# Patient Record
Sex: Female | Born: 1955
Health system: Southern US, Community
[De-identification: ages and names within clinical notes are randomized; demographics above are authoritative.]

## PROBLEM LIST (undated history)

## (undated) DIAGNOSIS — E7800 Pure hypercholesterolemia, unspecified: Secondary | ICD-10-CM

## (undated) DIAGNOSIS — J302 Other seasonal allergic rhinitis: Secondary | ICD-10-CM

## (undated) DIAGNOSIS — Z98811 Dental restoration status: Secondary | ICD-10-CM

## (undated) DIAGNOSIS — M1990 Unspecified osteoarthritis, unspecified site: Secondary | ICD-10-CM

## (undated) DIAGNOSIS — M67439 Ganglion, unspecified wrist: Secondary | ICD-10-CM

---

## 1988-05-12 HISTORY — PX: ORIF ANKLE FRACTURE: SHX5408

## 2000-03-17 ENCOUNTER — Encounter: Payer: Self-pay | Admitting: Family Medicine

## 2000-03-17 ENCOUNTER — Encounter: Admission: RE | Admit: 2000-03-17 | Discharge: 2000-03-17 | Payer: Self-pay | Admitting: Family Medicine

## 2000-03-24 ENCOUNTER — Encounter: Admission: RE | Admit: 2000-03-24 | Discharge: 2000-04-14 | Payer: Self-pay | Admitting: Family Medicine

## 2001-12-14 ENCOUNTER — Other Ambulatory Visit: Admission: RE | Admit: 2001-12-14 | Discharge: 2001-12-14 | Payer: Self-pay | Admitting: Obstetrics and Gynecology

## 2002-12-08 ENCOUNTER — Other Ambulatory Visit: Admission: RE | Admit: 2002-12-08 | Discharge: 2002-12-08 | Payer: Self-pay | Admitting: Obstetrics and Gynecology

## 2009-05-30 DIAGNOSIS — D239 Other benign neoplasm of skin, unspecified: Secondary | ICD-10-CM

## 2009-05-30 DIAGNOSIS — C4491 Basal cell carcinoma of skin, unspecified: Secondary | ICD-10-CM

## 2009-05-30 HISTORY — DX: Basal cell carcinoma of skin, unspecified: C44.91

## 2009-05-30 HISTORY — DX: Other benign neoplasm of skin, unspecified: D23.9

## 2010-08-28 ENCOUNTER — Other Ambulatory Visit: Payer: Self-pay | Admitting: Physician Assistant

## 2010-08-28 ENCOUNTER — Ambulatory Visit
Admission: RE | Admit: 2010-08-28 | Discharge: 2010-08-28 | Disposition: A | Discharge status: Home / Self Care | Payer: 59 | Source: Ambulatory Visit | Attending: Physician Assistant | Admitting: Physician Assistant

## 2010-08-28 DIAGNOSIS — R52 Pain, unspecified: Secondary | ICD-10-CM

## 2012-10-10 DIAGNOSIS — M67439 Ganglion, unspecified wrist: Secondary | ICD-10-CM

## 2012-10-10 HISTORY — DX: Ganglion, unspecified wrist: M67.439

## 2012-10-11 ENCOUNTER — Other Ambulatory Visit: Payer: Self-pay | Admitting: Orthopedic Surgery

## 2012-10-15 ENCOUNTER — Encounter (HOSPITAL_BASED_OUTPATIENT_CLINIC_OR_DEPARTMENT_OTHER): Payer: Self-pay | Admitting: *Deleted

## 2012-10-20 NOTE — H&P (Signed)
  Lisa Mclean is an 57 y.o. female.   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  She has had a six month history of enlarging mass on the volar aspect of her left wrist.  She is also troubled by pain at the base of her thumb.  She has no antecedent history of injury.  Lisa Mclean notes discomfort and occasional numbness radiating towards the base of her thumb.  She has had no treatment of this predicament to date.    Past Medical History  Diagnosis Date  . Ganglion cyst of wrist 10/2012    left  . Arthritis     hands, shoulders, hips  . Seasonal allergies     cough 10/15/2012  . Dental crowns present     Past Surgical History  Procedure Laterality Date  . Orif ankle fracture Left 1990    Family History  Problem Relation Age of Onset  . Anesthesia problems Father     hard to wake up post-op   Social History:  reports that she has never smoked. She has never used smokeless tobacco. She reports that  drinks alcohol. She reports that she does not use illicit drugs.  Allergies:  Allergies  Allergen Reactions  . Lipitor (Atorvastatin) Other (See Comments)    DIZZINESS    No prescriptions prior to admission    No results found for this or any previous visit (from the past 48 hour(s)).  No results found.   Pertinent items are noted in HPI.  Height 5\' 2"  (1.575 m), weight 63.504 kg (140 lb).  General appearance: alert Head: Normocephalic, without obvious abnormality Neck: supple, symmetrical, trachea midline Resp: clear bilaterally Cardio: regular rate and rhythm GI: normal findings: bowel sounds normal Extremities:. Inspection of her hands and wrists reveals minimal shouldering of her left thumb CMC joint.  She does not have gross hyperextension of the thumb MP joint.  She has full range of motion of her fingers in flexion/extension.  There is a fullness  over the dorsal aspect of her left long finger carpometacarpal joint consistent with a carpal boss or os overlying the distal capitate.    She has wrist range of motion palmar flexion 90, dorsiflexion 85.  Her Watson maneuver is stable, but slightly uncomfortable on the left.  Her motor and sensory examination is entirely normal.   Plain x-rays of her wrist AP, lateral and two obliques demonstrate normal bony anatomy of the carpus.  She does have Eaton stage II degenerative change at the base of her thumb.   Pulses: 2+ and symmetric Skin: normal Neurologic: Grossly normal    Assessment/Plan Impression:Left wrist volar ganglion cyst. The degenerative nature of myxoid cysts was discussed with the patient and her husband in detail. They understand that there is a small risk of recurrence.  Plan: To the OR for excision of volar wrist ganglion left wrist.The procedure, risks,benefits and post-op course were discussed with the patient at length and they were in agreement with the plan.  [name],Charly Holcomb J 10/20/2012, 9:32 PM  H&P documentation: 10/21/2012  -History and Physical Reviewed  -Patient has been re-examined  -No change in the plan of care  Wyn Forster, MD

## 2012-10-21 ENCOUNTER — Ambulatory Visit (HOSPITAL_BASED_OUTPATIENT_CLINIC_OR_DEPARTMENT_OTHER)
Admission: RE | Admit: 2012-10-21 | Discharge: 2012-10-21 | Disposition: A | Discharge status: Home / Self Care | Payer: 59 | Source: Ambulatory Visit | Attending: Orthopedic Surgery | Admitting: Orthopedic Surgery

## 2012-10-21 ENCOUNTER — Encounter (HOSPITAL_BASED_OUTPATIENT_CLINIC_OR_DEPARTMENT_OTHER): Payer: Self-pay | Admitting: Anesthesiology

## 2012-10-21 ENCOUNTER — Encounter (HOSPITAL_BASED_OUTPATIENT_CLINIC_OR_DEPARTMENT_OTHER): Payer: Self-pay

## 2012-10-21 ENCOUNTER — Encounter (HOSPITAL_BASED_OUTPATIENT_CLINIC_OR_DEPARTMENT_OTHER)
Admission: RE | Disposition: A | Discharge status: Home / Self Care | Payer: Self-pay | Source: Ambulatory Visit | Attending: Orthopedic Surgery

## 2012-10-21 ENCOUNTER — Ambulatory Visit (HOSPITAL_BASED_OUTPATIENT_CLINIC_OR_DEPARTMENT_OTHER): Payer: 59 | Admitting: Anesthesiology

## 2012-10-21 DIAGNOSIS — M6740 Ganglion, unspecified site: Secondary | ICD-10-CM | POA: Insufficient documentation

## 2012-10-21 HISTORY — DX: Other seasonal allergic rhinitis: J30.2

## 2012-10-21 HISTORY — DX: Ganglion, unspecified wrist: M67.439

## 2012-10-21 HISTORY — DX: Dental restoration status: Z98.811

## 2012-10-21 HISTORY — DX: Unspecified osteoarthritis, unspecified site: M19.90

## 2012-10-21 HISTORY — PX: GANGLION CYST EXCISION: SHX1691

## 2012-10-21 SURGERY — EXCISION, GANGLION CYST, WRIST
Anesthesia: General | Site: Wrist | Laterality: Left | Wound class: Clean

## 2012-10-21 MED ORDER — LIDOCAINE HCL (CARDIAC) 20 MG/ML IV SOLN
INTRAVENOUS | Status: DC | PRN
  Administered 2012-10-21: 80 mg via INTRAVENOUS

## 2012-10-21 MED ORDER — OXYCODONE-ACETAMINOPHEN 5-325 MG PO TABS: ORAL_TABLET | ORAL | Status: DC

## 2012-10-21 MED ORDER — CHLORHEXIDINE GLUCONATE 4 % EX LIQD
60.0000 mL | Freq: Once | CUTANEOUS | Status: DC
Start: 2012-10-21 — End: 2012-10-21

## 2012-10-21 MED ORDER — DEXAMETHASONE SODIUM PHOSPHATE 4 MG/ML IJ SOLN
INTRAMUSCULAR | Status: DC | PRN
  Administered 2012-10-21: 10 mg via INTRAVENOUS

## 2012-10-21 MED ORDER — FENTANYL CITRATE 0.05 MG/ML IJ SOLN
INTRAMUSCULAR | Status: DC | PRN
  Administered 2012-10-21: 50 ug via INTRAVENOUS

## 2012-10-21 MED ORDER — FENTANYL CITRATE 0.05 MG/ML IJ SOLN
25.0000 ug | INTRAMUSCULAR | Status: DC | PRN
  Administered 2012-10-21: 50 ug via INTRAVENOUS

## 2012-10-21 MED ORDER — METOCLOPRAMIDE HCL 5 MG/ML IJ SOLN: 10.0000 mg | Freq: Once | INTRAMUSCULAR | Status: DC | PRN

## 2012-10-21 MED ORDER — MIDAZOLAM HCL 5 MG/5ML IJ SOLN
INTRAMUSCULAR | Status: DC | PRN
  Administered 2012-10-21: 1 mg via INTRAVENOUS

## 2012-10-21 MED ORDER — OXYCODONE HCL 5 MG PO TABS
5.0000 mg | ORAL_TABLET | Freq: Once | ORAL | Status: AC | PRN
  Administered 2012-10-21: 5 mg via ORAL

## 2012-10-21 MED ORDER — ONDANSETRON HCL 4 MG/2ML IJ SOLN
INTRAMUSCULAR | Status: DC | PRN
  Administered 2012-10-21: 4 mg via INTRAVENOUS

## 2012-10-21 MED ORDER — LACTATED RINGERS IV SOLN
INTRAVENOUS | Status: DC
  Administered 2012-10-21: 08:00:00 via INTRAVENOUS

## 2012-10-21 MED ORDER — FENTANYL CITRATE 0.05 MG/ML IJ SOLN: 50.0000 ug | INTRAMUSCULAR | Status: DC | PRN

## 2012-10-21 MED ORDER — LIDOCAINE HCL 2 % IJ SOLN
INTRAMUSCULAR | Status: DC | PRN
  Administered 2012-10-21: 2 mL via INTRADERMAL

## 2012-10-21 MED ORDER — PROPOFOL 10 MG/ML IV BOLUS
INTRAVENOUS | Status: DC | PRN
  Administered 2012-10-21: 150 mg via INTRAVENOUS

## 2012-10-21 MED ORDER — MIDAZOLAM HCL 2 MG/ML PO SYRP: 12.0000 mg | ORAL_SOLUTION | Freq: Once | ORAL | Status: DC | PRN

## 2012-10-21 MED ORDER — MIDAZOLAM HCL 2 MG/2ML IJ SOLN: 1.0000 mg | INTRAMUSCULAR | Status: DC | PRN

## 2012-10-21 MED ORDER — OXYCODONE HCL 5 MG/5ML PO SOLN: 5.0000 mg | Freq: Once | ORAL | Status: AC | PRN

## 2012-10-21 SURGICAL SUPPLY — 45 items
BANDAGE ADHESIVE 1X3 (GAUZE/BANDAGES/DRESSINGS) IMPLANT
BANDAGE ELASTIC 3 VELCRO ST LF (GAUZE/BANDAGES/DRESSINGS) ×2 IMPLANT
BANDAGE GAUZE ELAST BULKY 4 IN (GAUZE/BANDAGES/DRESSINGS) IMPLANT
BLADE MINI RND TIP GREEN BEAV (BLADE) IMPLANT
BLADE SURG 15 STRL LF DISP TIS (BLADE) ×1 IMPLANT
BLADE SURG 15 STRL SS (BLADE) ×1
BNDG ESMARK 4X9 LF (GAUZE/BANDAGES/DRESSINGS) ×2 IMPLANT
BRUSH SCRUB EZ PLAIN DRY (MISCELLANEOUS) ×2 IMPLANT
CLOTH BEACON ORANGE TIMEOUT ST (SAFETY) ×2 IMPLANT
CORDS BIPOLAR (ELECTRODE) ×2 IMPLANT
COVER MAYO STAND STRL (DRAPES) ×2 IMPLANT
COVER TABLE BACK 60X90 (DRAPES) ×2 IMPLANT
CUFF TOURNIQUET SINGLE 18IN (TOURNIQUET CUFF) ×2 IMPLANT
DECANTER SPIKE VIAL GLASS SM (MISCELLANEOUS) IMPLANT
DRAPE EXTREMITY T 121X128X90 (DRAPE) ×2 IMPLANT
DRAPE SURG 17X23 STRL (DRAPES) ×2 IMPLANT
GLOVE BIOGEL M STRL SZ7.5 (GLOVE) ×2 IMPLANT
GLOVE BIOGEL PI IND STRL 6.5 (GLOVE) ×1 IMPLANT
GLOVE BIOGEL PI IND STRL 7.5 (GLOVE) ×1 IMPLANT
GLOVE BIOGEL PI INDICATOR 6.5 (GLOVE) ×1
GLOVE BIOGEL PI INDICATOR 7.5 (GLOVE) ×1
GLOVE ECLIPSE 7.5 STRL STRAW (GLOVE) ×2 IMPLANT
GLOVE ORTHO TXT STRL SZ7.5 (GLOVE) ×2 IMPLANT
GOWN BRE IMP PREV XXLGXLNG (GOWN DISPOSABLE) ×2 IMPLANT
GOWN PREVENTION PLUS XLARGE (GOWN DISPOSABLE) ×6 IMPLANT
LOOP VESSEL MAXI BLUE (MISCELLANEOUS) ×2 IMPLANT
NEEDLE 27GAX1X1/2 (NEEDLE) ×2 IMPLANT
PACK BASIN DAY SURGERY FS (CUSTOM PROCEDURE TRAY) ×2 IMPLANT
PAD CAST 3X4 CTTN HI CHSV (CAST SUPPLIES) ×1 IMPLANT
PADDING CAST ABS 4INX4YD NS (CAST SUPPLIES) ×1
PADDING CAST ABS COTTON 4X4 ST (CAST SUPPLIES) ×1 IMPLANT
PADDING CAST COTTON 3X4 STRL (CAST SUPPLIES) ×1
SPLINT PLASTER CAST XFAST 3X15 (CAST SUPPLIES) ×4 IMPLANT
SPLINT PLASTER XTRA FASTSET 3X (CAST SUPPLIES) ×4
SPONGE GAUZE 4X4 12PLY (GAUZE/BANDAGES/DRESSINGS) ×2 IMPLANT
STOCKINETTE 4X48 STRL (DRAPES) ×2 IMPLANT
STRIP CLOSURE SKIN 1/2X4 (GAUZE/BANDAGES/DRESSINGS) ×2 IMPLANT
SUT PROLENE 3 0 PS 2 (SUTURE) ×2 IMPLANT
SUT VIC AB 4-0 P-3 18XBRD (SUTURE) ×1 IMPLANT
SUT VIC AB 4-0 P3 18 (SUTURE) ×1
SYR 3ML 23GX1 SAFETY (SYRINGE) IMPLANT
SYR CONTROL 10ML LL (SYRINGE) ×2 IMPLANT
TOWEL OR 17X24 6PK STRL BLUE (TOWEL DISPOSABLE) ×2 IMPLANT
TRAY DSU PREP LF (CUSTOM PROCEDURE TRAY) ×2 IMPLANT
UNDERPAD 30X30 INCONTINENT (UNDERPADS AND DIAPERS) ×2 IMPLANT

## 2012-10-21 NOTE — Anesthesia Procedure Notes (Signed)
Procedure Name: LMA Insertion Date/Time: 10/21/2012 8:38 AM Performed by: Gar Gibbon Pre-anesthesia Checklist: Patient identified, Emergency Drugs available, Suction available and Patient being monitored Patient Re-evaluated:Patient Re-evaluated prior to inductionOxygen Delivery Method: Circle System Utilized Preoxygenation: Pre-oxygenation with 100% oxygen Intubation Type: IV induction Ventilation: Mask ventilation without difficulty LMA: LMA inserted LMA Size: 4.0 Number of attempts: 1 Airway Equipment and Method: bite block Placement Confirmation: positive ETCO2 Tube secured with: Tape Dental Injury: Teeth and Oropharynx as per pre-operative assessment

## 2012-10-21 NOTE — Anesthesia Preprocedure Evaluation (Signed)
Anesthesia Evaluation  Patient identified by MRN, date of birth, ID band Patient awake    Reviewed: Allergy & Precautions, H&P , NPO status , Patient's Chart, lab work & pertinent test results, reviewed documented beta blocker date and time   Airway Mallampati: II TM Distance: >3 FB Neck ROM: full    Dental   Pulmonary neg pulmonary ROS,  breath sounds clear to auscultation        Cardiovascular negative cardio ROS  Rhythm:regular     Neuro/Psych negative neurological ROS  negative psych ROS   GI/Hepatic negative GI ROS, Neg liver ROS,   Endo/Other  negative endocrine ROS  Renal/GU negative Renal ROS  negative genitourinary   Musculoskeletal   Abdominal   Peds  Hematology negative hematology ROS (+)   Anesthesia Other Findings See surgeon's H&P   Reproductive/Obstetrics negative OB ROS                           Anesthesia Physical Anesthesia Plan  ASA: I  Anesthesia Plan: General   Post-op Pain Management:    Induction: Intravenous  Airway Management Planned: LMA  Additional Equipment:   Intra-op Plan:   Post-operative Plan:   Informed Consent: I have reviewed the patients History and Physical, chart, labs and discussed the procedure including the risks, benefits and alternatives for the proposed anesthesia with the patient or authorized representative who has indicated his/her understanding and acceptance.   Dental Advisory Given  Plan Discussed with: CRNA and Surgeon  Anesthesia Plan Comments:         Anesthesia Quick Evaluation  

## 2012-10-21 NOTE — Transfer of Care (Signed)
Immediate Anesthesia Transfer of Care Note  Patient: Lisa Mclean  Procedure(s) Performed: Procedure(s): EXCISION LEFT VOLAR WRIST GANGLION (Left)  Patient Location: PACU  Anesthesia Type:General  Level of Consciousness: awake, sedated and patient cooperative  Airway & Oxygen Therapy: Patient Spontanous Breathing and Patient connected to face mask oxygen  Post-op Assessment: Report given to PACU RN and Post -op Vital signs reviewed and stable  Post vital signs: Reviewed and stable  Complications: No apparent anesthesia complications

## 2012-10-21 NOTE — Anesthesia Postprocedure Evaluation (Signed)
Anesthesia Post Note  Patient: Lisa Mclean  Procedure(s) Performed: Procedure(s) (LRB): EXCISION LEFT VOLAR WRIST GANGLION (Left)  Anesthesia type: General  Patient location: PACU  Post pain: Pain level controlled  Post assessment: Patient's Cardiovascular Status Stable  Last Vitals:  Filed Vitals:   10/21/12 1018  BP: 155/99  Pulse: 75  Temp:   Resp: 14    Post vital signs: Reviewed and stable  Level of consciousness: alert  Complications: No apparent anesthesia complications

## 2012-10-21 NOTE — Anesthesia Postprocedure Evaluation (Signed)
Anesthesia Post Note  Patient: Lisa Mclean  Procedure(s) Performed: Procedure(s) (LRB): EXCISION LEFT VOLAR WRIST GANGLION (Left)  Anesthesia type: General  Patient location: PACU  Post pain: Pain level controlled  Post assessment: Patient's Cardiovascular Status Stable  Last Vitals:  Filed Vitals:   10/21/12 1052  BP: 125/85  Pulse: 65  Temp: 36.5 C  Resp: 16    Post vital signs: Reviewed and stable  Level of consciousness: alert  Complications: No apparent anesthesia complications

## 2012-10-21 NOTE — Op Note (Signed)
[id number] 

## 2012-10-21 NOTE — Brief Op Note (Signed)
10/21/2012  9:44 AM  PATIENT:  Lisa Mclean  57 y.o. female  PRE-OPERATIVE DIAGNOSIS:  LEFT VOLAR GANGLION  POST-OPERATIVE DIAGNOSIS:  LEFT VOLAR GANGLION  PROCEDURE:  Procedure(s): EXCISION LEFT VOLAR WRIST GANGLION (Left)  SURGEON:  Surgeon(s) and Role:    * Wyn Forster., MD - Primary  PHYSICIAN ASSISTANT:   ASSISTANTS: surgical tech  ANESTHESIA:   general  EBL:  Total I/O In: 500 [I.V.:500] Out: -   BLOOD ADMINISTERED:none  DRAINS: none   LOCAL MEDICATIONS USED:  XYLOCAINE   SPECIMEN:  No Specimen  DISPOSITION OF SPECIMEN:  N/A  COUNTS:  YES  TOURNIQUET:   Total Tourniquet Time Documented: Upper Arm (Left) - 24 minutes Total: Upper Arm (Left) - 24 minutes   DICTATION: .Other Dictation: Dictation Number [id number]  PLAN OF CARE: Discharge to home after PACU  PATIENT DISPOSITION:  PACU - hemodynamically stable.   Delay start of Pharmacological VTE agent (>24hrs) due to surgical blood loss or risk of bleeding: not applicable

## 2012-10-22 ENCOUNTER — Encounter (HOSPITAL_BASED_OUTPATIENT_CLINIC_OR_DEPARTMENT_OTHER): Payer: Self-pay | Admitting: Orthopedic Surgery

## 2012-10-22 LAB — POCT HEMOGLOBIN-HEMACUE: Hemoglobin: 16.5 g/dL — ABNORMAL HIGH (ref 12.0–15.0)

## 2012-10-22 NOTE — Op Note (Signed)
NAMETARAN, HABLE            ACCOUNT NO.:  [id number]  MEDICAL RECORD NO.:  [id number]  LOCATION:                                 FACILITY:  PHYSICIAN:  Katy Fitch. Pauleen Goleman, M.D. DATE OF BIRTH:  05-28-1955  DATE OF PROCEDURE:  10/21/2012 DATE OF DISCHARGE:  10/21/2012                              OPERATIVE REPORT   PREOPERATIVE DIAGNOSIS:  Complex volar myxoid cyst presenting between superficial palmar branch and dorsal branch of radial artery left wrist with evidence of scaphotrapezotrapezoidal arthrosis.  POSTOPERATIVE DIAGNOSIS:  Complex volar myxoid cyst presenting between superficial palmar branch and dorsal branch of radial artery left wrist with evidence of scaphotrapezotrapezoidal arthrosis with identification of complex capsular ganglion that was infiltrating into the walls of the vena comitans of the radial artery and the branches to the scaphoid.  OPERATION:  Resection of complex volar myxoid cyst with debridement of radioscaphoid capitate and radioscaphoid ligaments.  OPERATING SURGEON:  Katy Fitch. Risha Barretta, MD.  ASSISTANT:  Surgical tech.  ANESTHESIA:  General by LMA.  SUPERVISING ANESTHESIOLOGIST:  Janetta Hora. Gelene Mink, M.D.  INDICATIONS:  Lisa Mclean is a 57 year old woman referred through the courtesy of Dr. Lupe Carney and Glory Rosebush, nurse practitioner for evaluation and management of a mass on the volar aspect of the left wrist.  Clinical examination revealed signs of STT arthrosis and a volar ganglion.  We had a detailed discussion regarding the nature of myxoid cyst.  These were very common on the volar aspect of the wrist capsule, typically originating from the vessels and/or radiocarpal ligaments. They can be very challenging to eradicate and at times placed the radial artery and its branches at risk.  We advised the length and family that we would make a best effort to debride the cyst, carefully inspect the capsule, debride the capsule  and follow the cyst to its origin.  Our plan would be to perform a superficial debridement of ligaments.  We cannot sacrifice the ligaments.  They understand there is a significant chance of recurrence in the next 5 years.  After informed consent, Ms. Hoston is brought to the operating room at this time.  Questions were invited and answered in detail in the office as well as in the holding area preoperatively.  PROCEDURE:  Banesa Tristan was interviewed in the holding area of the [facility] and her left hand and arm were identified as the proper surgical site per protocol.  She was then transferred to room 1 at the [facility] or under Dr. Thornton Dales direct supervision, general anesthesia by LMA technique was induced.  The left arm was prepped with Betadine soap and solution and sterilely draped with sterile stockinette and impervious arthroscopy drapes.  Following routine surgical time-out, the left hand and arm were exsanguinated with an Esmarch bandage, an arterial tourniquet was inflated to 220 mmHg on the proximal brachium.  The procedure commenced with planning of a Brunner zigzag incision in the volar wrist creases.  The skin incision was taken sharply, and the skin margins elevated, taking care to electrocauterize the transverse veins.  The radial artery proper was identified in the proximal aspect of the wound and secured with a vessel loop.  We then meticulously dissected the superficial branch of the radial artery, the branch to the carpus and the dorsal branch placing vessel loops.  We then circumferentially dissected the myxoid cyst that clearly did involve the vena comitantes of the carpal branches to the scaphoid and the dorsal branch.  We followed the neck of the cyst down to the space between the radioscaphoid capitate and radioscaphoid ligaments.  This was explored by retracting the ligaments and thoroughly debriding the capsule until we  visualized the waist of the scaphoid.  The wound was then irrigated and electrocautery was used to electrodesiccate the margins of the resection, followed by electrocautery of several small bleeding veins.  The tourniquet was released with immediate capillary refill to the fingers and thumb.  There was pulsatile flow through the 3 and named branches of the radial artery, without significant bleeding.  The wound was then closed in layers with subcutaneous 4-0 Vicryl and intradermal 3-0 Prolene segmental sutures.  A 2% lidocaine was infiltrated for postoperative comfort, followed by placement of Ms. Zabriskie's hand in a compressive dressing of sterile gauze, sterile Webril, and a volar plaster splint maintaining the wrist in 15 degrees of dorsiflexion.  There were no apparent complications.  For aftercare, Ms. Bahner was provided prescription for Percocet 5 mg 1 or 2 tablets p.o. q.4-6 hours p.r.n. pain, 20 tablets without refill.     Katy Fitch Hoa Briggs, M.D.     RVS/MEDQ  D:  10/21/2012  T:  10/22/2012  Job:  [id number]

## 2013-07-25 ENCOUNTER — Ambulatory Visit
Admission: RE | Admit: 2013-07-25 | Discharge: 2013-07-25 | Disposition: A | Discharge status: Home / Self Care | Payer: 59 | Source: Ambulatory Visit | Attending: Physician Assistant | Admitting: Physician Assistant

## 2013-07-25 ENCOUNTER — Other Ambulatory Visit: Payer: Self-pay | Admitting: Physician Assistant

## 2013-07-25 DIAGNOSIS — M549 Dorsalgia, unspecified: Secondary | ICD-10-CM

## 2014-10-02 ENCOUNTER — Other Ambulatory Visit: Payer: Self-pay | Admitting: Physician Assistant

## 2014-10-02 ENCOUNTER — Ambulatory Visit
Admission: RE | Admit: 2014-10-02 | Discharge: 2014-10-02 | Disposition: A | Discharge status: Home / Self Care | Payer: 59 | Source: Ambulatory Visit | Attending: Physician Assistant | Admitting: Physician Assistant

## 2014-10-02 DIAGNOSIS — R52 Pain, unspecified: Secondary | ICD-10-CM

## 2015-05-20 ENCOUNTER — Encounter: Payer: Self-pay | Admitting: Emergency Medicine

## 2015-05-20 ENCOUNTER — Emergency Department
Admission: EM | Admit: 2015-05-20 | Discharge: 2015-05-20 | Disposition: A | Discharge status: Home / Self Care | Payer: 59 | Attending: Emergency Medicine | Admitting: Emergency Medicine

## 2015-05-20 ENCOUNTER — Emergency Department: Payer: 59

## 2015-05-20 DIAGNOSIS — Y998 Other external cause status: Secondary | ICD-10-CM | POA: Insufficient documentation

## 2015-05-20 DIAGNOSIS — F17200 Nicotine dependence, unspecified, uncomplicated: Secondary | ICD-10-CM | POA: Diagnosis not present

## 2015-05-20 DIAGNOSIS — S52122A Displaced fracture of head of left radius, initial encounter for closed fracture: Secondary | ICD-10-CM

## 2015-05-20 DIAGNOSIS — W000XXA Fall on same level due to ice and snow, initial encounter: Secondary | ICD-10-CM | POA: Insufficient documentation

## 2015-05-20 DIAGNOSIS — S59912A Unspecified injury of left forearm, initial encounter: Secondary | ICD-10-CM | POA: Diagnosis present

## 2015-05-20 DIAGNOSIS — S52592A Other fractures of lower end of left radius, initial encounter for closed fracture: Secondary | ICD-10-CM | POA: Insufficient documentation

## 2015-05-20 DIAGNOSIS — Y9389 Activity, other specified: Secondary | ICD-10-CM | POA: Insufficient documentation

## 2015-05-20 DIAGNOSIS — Y9289 Other specified places as the place of occurrence of the external cause: Secondary | ICD-10-CM | POA: Diagnosis not present

## 2015-05-20 HISTORY — DX: Pure hypercholesterolemia, unspecified: E78.00

## 2015-05-20 MED ORDER — HYDROCODONE-ACETAMINOPHEN 5-325 MG PO TABS
2.0000 | ORAL_TABLET | Freq: Once | ORAL | Status: AC
  Administered 2015-05-20: 2 via ORAL
  Filled 2015-05-20: qty 2

## 2015-05-20 MED ORDER — IBUPROFEN 800 MG PO TABS: 800.0000 mg | ORAL_TABLET | Freq: Three times a day (TID) | ORAL | Status: DC | PRN

## 2015-05-20 MED ORDER — HYDROCODONE-ACETAMINOPHEN 5-325 MG PO TABS: 1.0000 | ORAL_TABLET | ORAL | Status: DC | PRN

## 2015-05-20 NOTE — Discharge Instructions (Signed)
Cast or Splint Care °Casts and splints support injured limbs and keep bones from moving while they heal. It is important to care for your cast or splint at home.   °HOME CARE INSTRUCTIONS °· Keep the cast or splint uncovered during the drying period. It can take 24 to 48 hours to dry if it is made of plaster. A fiberglass cast will dry in less than 1 hour. °· Do not rest the cast on anything harder than a pillow for the first 24 hours. °· Do not put weight on your injured limb or apply pressure to the cast until your health care provider gives you permission. °· Keep the cast or splint dry. Wet casts or splints can lose their shape and may not support the limb as well. A wet cast that has lost its shape can also create harmful pressure on your skin when it dries. Also, wet skin can become infected. °· Cover the cast or splint with a plastic bag when bathing or when out in the rain or snow. If the cast is on the trunk of the body, take sponge baths until the cast is removed. °· If your cast does become wet, dry it with a towel or a blow dryer on the cool setting only. °· Keep your cast or splint clean. Soiled casts may be wiped with a moistened cloth. °· Do not place any hard or soft foreign objects under your cast or splint, such as cotton, toilet paper, lotion, or powder. °· Do not try to scratch the skin under the cast with any object. The object could get stuck inside the cast. Also, scratching could lead to an infection. If itching is a problem, use a blow dryer on a cool setting to relieve discomfort. °· Do not trim or cut your cast or remove padding from inside of it. °· Exercise all joints next to the injury that are not immobilized by the cast or splint. For example, if you have a long leg cast, exercise the hip joint and toes. If you have an arm cast or splint, exercise the shoulder, elbow, thumb, and fingers. °· Elevate your injured arm or leg on 1 or 2 pillows for the first 1 to 3 days to decrease  swelling and pain. It is best if you can comfortably elevate your cast so it is higher than your heart. °SEEK MEDICAL CARE IF:  °· Your cast or splint cracks. °· Your cast or splint is too tight or too loose. °· You have unbearable itching inside the cast. °· Your cast becomes wet or develops a soft spot or area. °· You have a bad smell coming from inside your cast. °· You get an object stuck under your cast. °· Your skin around the cast becomes red or raw. °· You have new pain or worsening pain after the cast has been applied. °SEEK IMMEDIATE MEDICAL CARE IF:  °· You have fluid leaking through the cast. °· You are unable to move your fingers or toes. °· You have discolored (blue or white), cool, painful, or very swollen fingers or toes beyond the cast. °· You have tingling or numbness around the injured area. °· You have severe pain or pressure under the cast. °· You have any difficulty with your breathing or have shortness of breath. °· You have chest pain. °  °This information is not intended to replace advice given to you by your health care provider. Make sure you discuss any questions you have with your health care   provider. °  °Document Released: 04/25/2000 Document Revised: 02/16/2013 Document Reviewed: 11/04/2012 °Elsevier Interactive Patient Education ©2016 [employer] ° °Forearm Fracture °A forearm fracture is a break in one or both of the bones of your arm that are between the elbow and the wrist. Your forearm is made up of two bones: °· Radius. This is the bone on the inside of your arm near your thumb. °· Ulna. This is the bone on the outside of your arm near your little finger. °Middle forearm fractures usually break both the radius and the ulna. Most forearm fractures that involve both the ulna and radius will require surgery. °CAUSES °Common causes of this type of fracture include: °· Falling on an outstretched arm. °· Accidents, such as a car or bike accident. °· A hard, direct hit to the middle  part of your arm. °RISK FACTORS °You may be at higher risk for this type of fracture if: °· You play contact sports. °· You have a condition that causes your bones to be weak or thin (osteoporosis). °SIGNS AND SYMPTOMS °A forearm fracture causes pain immediately after the injury. Other signs and symptoms include: °· An abnormal bend or bump in your arm (deformity). °· Swelling. °· Numbness or tingling. °· Tenderness. °· Inability to turn your hand from side to side (rotate). °· Bruising. °DIAGNOSIS °Your health care provider may diagnose a forearm fracture based on: °· Your symptoms. °· Your medical history, including any recent injury. °· A physical exam. Your health care provider will look for any deformity and feel for tenderness over the break. Your health care provider will also check whether the bones are out of place. °· An X-ray exam to confirm the diagnosis and learn more about the type of fracture. °TREATMENT °The goals of treatment are to get the bone or bones in proper position for healing and to keep the bones from moving so they will heal over time. Your treatment will depend on many factors, especially the type of fracture that you have. °· If the fractured bone or bones: °¨ Are in the correct position (nondisplaced), you may only need to wear a cast or a splint. °¨ Have a slightly displaced fracture, you may need to have the bones moved back into place manually (closed reduction) before the splint or cast is put on. °· You may have a temporary splint before you have a cast. The splint allows room for some swelling. After a few days, a cast can replace the splint. °· You may have to wear the cast for 6-8 weeks or as directed by your health care provider. °· The cast may be changed after about 3 weeks or as directed by your health care provider. °· After your cast is removed, you may need physical therapy to regain full movement in your wrist or elbow. °· You may need emergency surgery if you  have: °¨ A fractured bone or bones that are out of position (displaced). °¨ A fracture with multiple fragments (comminuted fracture). °¨ A fracture that breaks the skin (open fracture). This type of fracture may require surgical wires, plates, or screws to hold the bone or bones in place. °· You may have X-rays every couple of weeks to check on your healing. °HOME CARE INSTRUCTIONS °If You Have a Cast: °· Do not stick anything inside the cast to scratch your skin. Doing that increases your risk of infection. °· Check the skin around the cast every day. Report any concerns to your health care   provider. You may put lotion on dry skin around the edges of the cast. Do not apply lotion to the skin underneath the cast. °If You Have a Splint: °· Wear it as directed by your health care provider. Remove it only as directed by your health care provider. °· Loosen the splint if your fingers become numb and tingle, or if they turn cold and blue. °Bathing °· Cover the cast or splint with a watertight plastic bag to protect it from water while you bathe or shower. Do not let the cast or splint get wet. °Managing Pain, Stiffness, and Swelling °· If directed, apply ice to the injured area: °¨ Put ice in a plastic bag. °¨ Place a towel between your skin and the bag. °¨ Leave the ice on for 20 minutes, 2-3 times a day. °· Move your fingers often to avoid stiffness and to lessen swelling. °· Raise the injured area above the level of your heart while you are sitting or lying down. °Driving °· Do not drive or operate heavy machinery while taking pain medicine. °· Do not drive while wearing a cast or splint on a hand that you use for driving. °Activity °· Return to your normal activities as directed by your health care provider. Ask your health care provider what activities are safe for you. °· Perform range-of-motion exercises only as directed by your health care provider. °Safety °· Do not use your injured limb to support your body  weight until your health care provider says that you can. °General Instructions °· Do not put pressure on any part of the cast or splint until it is fully hardened. This may take several hours. °· Keep the cast or splint clean and dry. °· Do not use any tobacco products, including cigarettes, chewing tobacco, or electronic cigarettes. Tobacco can delay bone healing. If you need help quitting, ask your health care provider. °· Take medicines only as directed by your health care provider. °· Keep all follow-up visits as directed by your health care provider. This is important. °SEEK MEDICAL CARE IF: °· Your pain medicine is not helping. °· Your cast or splint becomes wet or damaged or suddenly feels too tight. °· Your cast becomes loose. °· You have more severe pain or swelling than you did before the cast. °· You have severe pain when you stretch your fingers. °· You continue to have pain or stiffness in your elbow or your wrist after your cast is removed. °SEEK IMMEDIATE MEDICAL CARE IF: °· You cannot move your fingers. °· You lose feeling in your fingers or your hand. °· Your hand or your fingers turn cold and pale or blue. °· You notice a bad smell coming from your cast. °· You have drainage from underneath your cast. °· You have new stains from blood or drainage that is coming through your cast. °  °This information is not intended to replace advice given to you by your health care provider. Make sure you discuss any questions you have with your health care provider. °  °Document Released: 04/25/2000 Document Revised: 05/19/2014 Document Reviewed: 12/12/2013 °Elsevier Interactive Patient Education ©2016 [employer] ° °

## 2015-05-20 NOTE — ED Provider Notes (Signed)
[facility] Emergency Department Provider Note  ____________________________________________  Time seen: Approximately 6:24 PM  I have reviewed the triage vital signs and the nursing notes.   HISTORY  Chief Complaint Fall    HPI Lisa Mclean is a 60 y.o. female presents for evaluation of left arm pain. Patient states that she slipped on the ice landing on her forearm. Complains of pain around distal shaft   Past Medical History  Diagnosis Date  . Ganglion cyst of wrist 10/2012    left  . Arthritis     hands, shoulders, hips  . Seasonal allergies     cough 10/15/2012  . Dental crowns present   . Hypercholesteremia     There are no active problems to display for this patient.   Past Surgical History  Procedure Laterality Date  . Orif ankle fracture Left 1990  . Ganglion cyst excision Left 10/21/2012    Procedure: EXCISION LEFT VOLAR WRIST GANGLION;  Surgeon: Cammie Sickle., MD;  Location: [place];  Service: Orthopedics;  Laterality: Left;    Current Outpatient Rx  Name  Route  Sig  Dispense  Refill  . HYDROcodone-acetaminophen (NORCO) 5-325 MG tablet   Oral   Take 1-2 tablets by mouth every 4 (four) hours as needed for moderate pain.   20 tablet   0   . ibuprofen (ADVIL,MOTRIN) 800 MG tablet   Oral   Take 1 tablet (800 mg total) by mouth every 8 (eight) hours as needed.   30 tablet   0     Allergies Lipitor  Family History  Problem Relation Age of Onset  . Anesthesia problems Father     hard to wake up post-op    Social History Social History  Substance Use Topics  . Smoking status: Current Some Day Smoker  . Smokeless tobacco: Never Used  . Alcohol Use: Yes     Comment: rare    Review of Systems Constitutional: No fever/chills Eyes: No visual changes. ENT: No sore throat. Cardiovascular: Denies chest pain. Respiratory: Denies shortness of breath. Gastrointestinal: No abdominal pain.  No  nausea, no vomiting.  No diarrhea.  No constipation. Genitourinary: Negative for dysuria. Musculoskeletal: Positive for left wrist pain. Skin: Negative for rash. Neurological: Negative for headaches, focal weakness or numbness.  10-point ROS otherwise negative.  ____________________________________________   PHYSICAL EXAM:  VITAL SIGNS: ED Triage Vitals  Enc Vitals Group     BP 05/20/15 1807 196/95 mmHg     Pulse Rate 05/20/15 1807 105     Resp 05/20/15 1807 16     Temp 05/20/15 1807 98.3 F (36.8 C)     Temp Source 05/20/15 1807 Oral     SpO2 05/20/15 1807 98 %     Weight 05/20/15 1807 140 lb (63.504 kg)     Height 05/20/15 1807 5\' 2"  (1.575 m)     Head Cir --      Peak Flow --      Pain Score 05/20/15 1807 7     Pain Loc --      Pain Edu? --      Excl. in [place]? --     Constitutional: Alert and oriented. Well appearing and in no acute distress.   Cardiovascular: Normal rate, regular rhythm. Grossly normal heart sounds.  Good peripheral circulation. Respiratory: Normal respiratory effort.  No retractions. Lungs CTAB. Musculoskeletal: Left wrist with limited range of motion. Increased pain with pronation to supination. Distally neurovascularly intact.  Positive edema noted. No ecchymosis or bruising noted. Neurologic:  Normal speech and language. No gross focal neurologic deficits are appreciated. No gait instability. Skin:  Skin is warm, dry and intact. No rash noted. Psychiatric: Mood and affect are normal. Speech and behavior are normal.  ____________________________________________   LABS (all labs ordered are listed, but only abnormal results are displayed)  Labs Reviewed - No data to display ____________________________________________   RADIOLOGY  FINDINGS: The patient has a mildly impacted fracture of the metaphysis of the left radius. No disruption of the articular surface is identified. No other fracture is seen. Soft tissue swelling about the wrist  is identified. First CMC osteoarthritis is noted.  IMPRESSION: Mildly impacted fracture distal metaphysis left radius with associated soft tissue swelling. ____________________________________________   PROCEDURES  Procedure(s) performed: None  Critical Care performed: No  ____________________________________________   INITIAL IMPRESSION / ASSESSMENT AND PLAN / ED COURSE  Pertinent labs & imaging results that were available during my care of the patient were reviewed by me and considered in my medical decision making (see chart for details).  Mildly impacted distal radial fracture. Rx given for Vicodin 5/325, Motrin 800 mg 3 times a day. Posterior splint and sling applied. Distally neurovascularly intact after splint application. Patient follow-up with orthopedics on-call this week. ____________________________________________   FINAL CLINICAL IMPRESSION(S) / ED DIAGNOSES  Final diagnoses:  Radial head fracture, closed, left, initial encounter     Arlyss Repress, PA-C 05/20/15 [place], MD 05/20/15 [phone]

## 2015-05-20 NOTE — ED Notes (Signed)
Pt was helping daughter move and fell on a spot of ice. Landed on left arm. Pain is to left forearm. No deformity seen in triage.

## 2015-07-16 ENCOUNTER — Ambulatory Visit: Payer: 59 | Attending: Orthopedic Surgery | Admitting: Occupational Therapy

## 2015-07-16 DIAGNOSIS — R202 Paresthesia of skin: Secondary | ICD-10-CM | POA: Diagnosis present

## 2015-07-16 DIAGNOSIS — M25632 Stiffness of left wrist, not elsewhere classified: Secondary | ICD-10-CM

## 2015-07-16 DIAGNOSIS — M79632 Pain in left forearm: Secondary | ICD-10-CM | POA: Diagnosis present

## 2015-07-16 DIAGNOSIS — M6281 Muscle weakness (generalized): Secondary | ICD-10-CM
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
## 2015-07-16 NOTE — Patient Instructions (Signed)
Contrast  PROM for wrist sup, Ext, Flexion , RD and UD  AROM for wrist in all planes   Tendon glides   Medial N glides

## 2015-07-24 ENCOUNTER — Ambulatory Visit: Payer: 59 | Admitting: Occupational Therapy

## 2015-07-24 DIAGNOSIS — M25632 Stiffness of left wrist, not elsewhere classified: Secondary | ICD-10-CM | POA: Diagnosis not present

## 2015-07-24 DIAGNOSIS — R202 Paresthesia of skin: Secondary | ICD-10-CM

## 2015-07-24 DIAGNOSIS — M79632 Pain in left forearm: Secondary | ICD-10-CM

## 2015-07-24 DIAGNOSIS — M6281 Muscle weakness (generalized): Secondary | ICD-10-CM

## 2015-07-24 NOTE — Therapy (Signed)
[facility] [address] [place], [address] Phone: [phone]   Fax:  [phone]  Occupational Therapy Treatment  Patient Details  Name: Lisa Mclean MRN: [id number] Date of Birth: 1956-01-24 Referring Provider: Arvella Nigh   Encounter Date: 07/24/2015      OT End of Session - 07/24/15 1720    Visit Number 2   Number of Visits 6   Date for OT Re-Evaluation 08/27/15   OT Start Time 1030   OT Stop Time 1120   OT Time Calculation (min) 50 min   Activity Tolerance Patient tolerated treatment well   Behavior During Therapy [facility] for tasks assessed/performed      Past Medical History  Diagnosis Date  . Ganglion cyst of wrist 10/2012    left  . Arthritis     hands, shoulders, hips  . Seasonal allergies     cough 10/15/2012  . Dental crowns present   . Hypercholesteremia     Past Surgical History  Procedure Laterality Date  . Orif ankle fracture Left 1990  . Ganglion cyst excision Left 10/21/2012    Procedure: EXCISION LEFT VOLAR WRIST GANGLION;  Surgeon: Cammie Sickle., MD;  Location: [place];  Service: Orthopedics;  Laterality: Left;    There were no vitals filed for this visit.  Visit Diagnosis:  Stiffness of left wrist joint  Pain of left forearm  Muscle weakness  Pins and needles sensation      Subjective Assessment - 07/24/15 1031    Subjective  Still throbbing  at nght time and pins and needles - and during day still into thumb - but I think it is coming from myshoulder if I move that feel it into my thumb - my neck is stiff on the L side - wrist doing okay - exercises did okay - using it little more  - but did carry garnd daughter over the weekend -and felt it that night    Patient Stated Goals I want to get the pain at nght time better , pins and needles - as well as be able to use my L hand in open jars, cut food, take care of 21 month old grand baby,  cross stitch  , drive    Currently in Pain? Yes   Pain Score 1    Pain Location Finger (Comment which one)   Pain Orientation Left   Pain Descriptors / Indicators Tingling            [facility] OT Assessment - 07/24/15 0001    AROM   Left Forearm Pronation 85 Degrees   Left Forearm Supination 80 Degrees   Left Wrist Extension 55 Degrees   Left Wrist Flexion 48 Degrees   Left Wrist Radial Deviation 25 Degrees   Left Wrist Ulnar Deviation 25 Degrees                  OT Treatments/Exercises (OP) - 07/24/15 0001    LUE Fluidotherapy   Number Minutes Fluidotherapy 10 Minutes   LUE Fluidotherapy Location Hand;Wrist   Comments AROM to wrist in all planes prior to ther ex - to decrease pain and increase ROM       After fluido 10 min with AROM - decrease pain and increase ROM  PT did cervical screen for pins and needles during day on and off - throbbing pain , pins and needles at night time - tight upper traps Upper trap and  scalens tight - trigger points - pt to use heat and stretch 2 x day 5 reps - hold 3-5 sec  PROM for wrist ext/flexion   focus on flexion this date- pt did not do it as much   AROM in all planes to followed  Add 1 lbs weight for all ranges  Except wrist extennetion  Add place and hold for extention  - AROM flexion   Teal putty for grip , lat and 3 point grip  10 reps each  Each   2 x day             OT Education - 07/24/15 1720    Education provided Yes   Education Details HEP    Person(s) Educated Patient   Methods Explanation;Tactile cues;Verbal cues;Demonstration;Handout   Comprehension Verbal cues required;Returned demonstration;Verbalized understanding          OT Short Term Goals - 07/16/15 1738    OT SHORT TERM GOAL #1   Title Pain on PRWHE improve with at least 10 points    Baseline Pain at eval 22/50   Time 3   Period Weeks   Status New   OT SHORT TERM GOAL #2   Title AROM at wrist flexion , extention , supination improve with more  10-15 degrees to turn doorkonb and push door open    Baseline Sup 62. Flexion 44/ ext 35    Time 4   Period Weeks   Status New   OT SHORT TERM GOAL #3   Title Pt to be ind in HEP and modifications in tasks  to increase ROM , decrease pain and sensory changes    Baseline little knowledge    Time 4   Period Weeks   Status New           OT Long Term Goals - 07/16/15 1742    OT LONG TERM GOAL #1   Title Grip strenght in L hand improve with 5-10 lbs to carry more than 6 lbs    Baseline grip 30L , R 62 lbs    Time 6   Period Weeks   Status New   OT LONG TERM GOAL #2   Title Prehension strength improve with at least 3-4 lbs to open package, open jar    Baseline L 19 R, 13 L lat grip ; 3 point R 20; L 11lbs    Time 5   Period Weeks   Status New               Plan - 07/24/15 1720    Clinical Impression Statement Pt showed increase ROM at wrist but grip about the same - but did not work on strength yet - pt  reprort sensory changes wth shoudler ROM - PT asseess and upper traps/scalens tight - provided some stretches to do - and  did  increase  HEP for wrist to 1 lbs and putty for grip    Pt will benefit from skilled therapeutic intervention in order to improve on the following deficits (Retired) Decreased range of motion;Impaired flexibility;Impaired sensation;Pain;Decreased strength;Impaired UE functional use   Rehab Potential Good   OT Frequency 1x / week   OT Duration 4 weeks   OT Treatment/Interventions Patient/family education;Therapeutic exercise;Self-care/ADL training;Manual Therapy;Splinting;Fluidtherapy;Contrast Bath;Passive range of motion   Plan assess pain , ROM , sensory changes - upgrade as needed    OT Home Exercise Plan see pt instruction   Consulted and Agree with Plan of Care Patient  Problem List There are no active problems to display for this patient.   Rosalyn Gess OTR/l,CLT  07/24/2015, 5:23 PM  [facility] [facility] [address], [place], [address] Phone: [phone]   Fax:  [phone]  Name: Lisa Mclean MRN: [id number] Date of Birth: 1955/09/29

## 2015-07-24 NOTE — Patient Instructions (Addendum)
Pt to focus on flexion of wrist  Add 1 lbs for all range - except wrist extnetion place and hold , flexion AROM end range  Pain less than 2/10   Teal putty for grip , lat and 3 point grip  10 reps each   2-3 x day   Add stretches for upper trap , scalens -  See note

## 2015-08-02 ENCOUNTER — Ambulatory Visit: Payer: 59 | Admitting: Occupational Therapy

## 2015-08-02 DIAGNOSIS — R202 Paresthesia of skin: Secondary | ICD-10-CM

## 2015-08-02 DIAGNOSIS — M6281 Muscle weakness (generalized): Secondary | ICD-10-CM

## 2015-08-02 DIAGNOSIS — M25632 Stiffness of left wrist, not elsewhere classified: Secondary | ICD-10-CM

## 2015-08-02 DIAGNOSIS — M79632 Pain in left forearm: Secondary | ICD-10-CM

## 2015-08-02 NOTE — Patient Instructions (Signed)
   Pt to hold off on wrist flexion stretch  But do prayer stretch   2 lbs for sup/pro, RD and UD , wrist extention - but only 6-8 reps without increase CT symptoms   PUtty teal for grip , 3 point and add pulling and twisting  10-12 reps - 2 x day  Pt to only do 6-8 reps of lat grip - stop prior to CT symptoms  Cont with MEd N glides - needed min v/c

## 2015-08-02 NOTE — Therapy (Signed)
[facility] [address] [place], [address] Phone: [phone]   Fax:  [phone]  Occupational Therapy Treatment  Patient Details  Name: Lisa Mclean MRN: [id number] Date of Birth: 12-Aug-1955 Referring Provider: Arvella Nigh   Encounter Date: 08/02/2015      OT End of Session - 08/02/15 1824    Visit Number 3   Number of Visits 6   Date for OT Re-Evaluation 08/27/15   OT Start Time 1630   OT Stop Time 1725   OT Time Calculation (min) 55 min   Activity Tolerance Patient tolerated treatment well   Behavior During Therapy [facility] for tasks assessed/performed      Past Medical History  Diagnosis Date  . Ganglion cyst of wrist 10/2012    left  . Arthritis     hands, shoulders, hips  . Seasonal allergies     cough 10/15/2012  . Dental crowns present   . Hypercholesteremia     Past Surgical History  Procedure Laterality Date  . Orif ankle fracture Left 1990  . Ganglion cyst excision Left 10/21/2012    Procedure: EXCISION LEFT VOLAR WRIST GANGLION;  Surgeon: Cammie Sickle., MD;  Location: [place];  Service: Orthopedics;  Laterality: Left;    There were no vitals filed for this visit.  Visit Diagnosis:  Stiffness of left wrist joint  Pain of left forearm  Muscle weakness  Pins and needles sensation      Subjective Assessment - 08/02/15 1818    Subjective  Stilll having stiffness in fingers and hand at night time - and numbness - my neck and in my hand - mythumb now stays numb during day - doing my exerciises    Patient Stated Goals I want to get the pain at nght time better , pins and needles - as well as be able to use my L hand in open jars, cut food, take care of 9 month old grand baby,  cross stitch , drive    Currently in Pain? Yes   Pain Score 3    Pain Location Wrist   Pain Orientation Left   Pain Descriptors / Indicators Aching            [facility] OT Assessment  - 08/02/15 0001    Strength   Right Hand Grip (lbs) 64   Right Hand Lateral Pinch 19 lbs   Right Hand 3 Point Pinch 20 lbs   Left Hand Grip (lbs) 40   Left Hand Lateral Pinch 15 lbs   Left Hand 3 Point Pinch 12 lbs                  OT Treatments/Exercises (OP) - 08/02/15 0001    LUE Fluidotherapy   Number Minutes Fluidotherapy 12 Minutes   LUE Fluidotherapy Location Hand;Wrist   Comments AROM at [facility] for digits and wrist in all planes to increase ROM and decrease pain       Pt positive test for [place] for CT  Pt had increase CTS with tight , sustained grip during 2 lbs exercises   wrist flexion exercises  And lat grip with putty   Pt to hold off on wrist flexion stretch  But do prayer stretch  Did 2 lbs for sup/pro, RD and UD , wrist extention - but only 6-8 reps without increase CT symptoms   PUtty teal for grip , 3 point and add pulling and twisting  -  pt had no symptoms  Pt to only do 6-8 reps of lat grip - stop prior to CT symptoms  Cont with MEd N glides - needed min v/c           OT Education - 08/02/15 1824    Education provided Yes   Education Details HEP    Person(s) Educated Patient   Methods Explanation;Demonstration;Tactile cues;Verbal cues   Comprehension Verbal cues required;Returned demonstration;Verbalized understanding          OT Short Term Goals - 07/16/15 1738    OT SHORT TERM GOAL #1   Title Pain on PRWHE improve with at least 10 points    Baseline Pain at eval 22/50   Time 3   Period Weeks   Status New   OT SHORT TERM GOAL #2   Title AROM at wrist flexion , extention , supination improve with more 10-15 degrees to turn doorkonb and push door open    Baseline Sup 62. Flexion 44/ ext 35    Time 4   Period Weeks   Status New   OT SHORT TERM GOAL #3   Title Pt to be ind in HEP and modifications in tasks  to increase ROM , decrease pain and sensory changes    Baseline little knowledge    Time 4   Period Weeks   Status New            OT Long Term Goals - 07/16/15 1742    OT LONG TERM GOAL #1   Title Grip strenght in L hand improve with 5-10 lbs to carry more than 6 lbs    Baseline grip 30L , R 62 lbs    Time 6   Period Weeks   Status New   OT LONG TERM GOAL #2   Title Prehension strength improve with at least 3-4 lbs to open package, open jar    Baseline L 19 R, 13 L lat grip ; 3 point R 20; L 11lbs    Time 5   Period Weeks   Status New               Plan - 08/02/15 1824    Clinical Impression Statement Pt cont to have some numbness in thumb that is during day now there - increase nubness with sustained grip - pt to builtup handles of weigth and not tight grip - and avoid wrist flexion this week- pt do have some tightness and spasm in upper trap and cervical on L from compensation when was in cast and went back to typing   - increase discomfortt at night time - pt to see MD tomorrow - recommmend maybe nerve conducation    Pt will benefit from skilled therapeutic intervention in order to improve on the following deficits (Retired) Decreased range of motion;Impaired flexibility;Impaired sensation;Pain;Decreased strength;Impaired UE functional use   Rehab Potential Good   OT Frequency 1x / week   OT Duration 4 weeks   OT Treatment/Interventions Patient/family education;Therapeutic exercise;Self-care/ADL training;Manual Therapy;Splinting;Fluidtherapy;Contrast Bath;Passive range of motion   Plan MD aptt results , progress   OT Home Exercise Plan see pt instruction   Consulted and Agree with Plan of Care Patient        Problem List There are no active problems to display for this patient.   Rosalyn Gess OTR/l,CLT  08/02/2015, 6:28 PM  [place] [facility] [address] [place], [address] Phone: [phone]   Fax:  [phone]  Name: Lisa Mclean MRN: [id number] Date of Birth: Apr 03, 1956

## 2015-08-09 ENCOUNTER — Ambulatory Visit: Payer: 59 | Admitting: Occupational Therapy

## 2015-08-09 DIAGNOSIS — R202 Paresthesia of skin: Secondary | ICD-10-CM

## 2015-08-09 DIAGNOSIS — M25632 Stiffness of left wrist, not elsewhere classified: Secondary | ICD-10-CM

## 2015-08-09 DIAGNOSIS — M6281 Muscle weakness (generalized): Secondary | ICD-10-CM

## 2015-08-09 DIAGNOSIS — M79632 Pain in left forearm: Secondary | ICD-10-CM

## 2015-08-09 NOTE — Therapy (Signed)
[place] [facility] [address] [place], [address] Phone: [phone]   Fax:  [phone]  Occupational Therapy Treatment  Patient Details  Name: NANDIKA STETZER MRN: [id number] Date of Birth: Nov 23, 1955 Referring Provider: Arvella Nigh   Encounter Date: 08/09/2015      OT End of Session - 08/09/15 1730    Visit Number 4   Number of Visits 6   Date for OT Re-Evaluation 08/27/15   OT Start Time 1631   OT Stop Time 1713   OT Time Calculation (min) 42 min   Activity Tolerance Patient tolerated treatment well   Behavior During Therapy [facility] for tasks assessed/performed      Past Medical History  Diagnosis Date  . Ganglion cyst of wrist 10/2012    left  . Arthritis     hands, shoulders, hips  . Seasonal allergies     cough 10/15/2012  . Dental crowns present   . Hypercholesteremia     Past Surgical History  Procedure Laterality Date  . Orif ankle fracture Left 1990  . Ganglion cyst excision Left 10/21/2012    Procedure: EXCISION LEFT VOLAR WRIST GANGLION;  Surgeon: Cammie Sickle., MD;  Location: [place];  Service: Orthopedics;  Laterality: Left;    There were no vitals filed for this visit.  Visit Diagnosis:  Stiffness of left wrist joint  Pins and needles sensation  Pain of left forearm  Muscle weakness      Subjective Assessment - 08/09/15 1724    Subjective  I seen Gerald Stabs , the PA and they scheduled nerve conduction test for next Wed - my pain is better at night time with that wrist splint - but the numbness stays in my thumb lately during day   Patient Stated Goals I want to get the pain at nght time better , pins and needles - as well as be able to use my L hand in open jars, cut food, take care of 77 month old grand baby,  cross stitch , drive    Currently in Pain? Yes   Pain Score 2    Pain Location Wrist   Pain Orientation Left   Pain Descriptors / Indicators Aching             [facility] OT Assessment - 08/09/15 0001    AROM   Left Wrist Extension 63 Degrees  end   Left Wrist Flexion 63 Degrees  end                  OT Treatments/Exercises (OP) - 08/09/15 0001    LUE Fluidotherapy   Number Minutes Fluidotherapy 10 Minutes   LUE Fluidotherapy Location Hand;Wrist   Comments AROM for wrist in all planes at [facility] to increase ROM and decrease pain       After fluido did some Graston tool 4 and 2 on flexors of forearm  With scooping , sweeping  Prior to ROM  Wrist flexion and extention AAROM done - pt to cont with stretches but hold off on 2 lbs for wrist flexion/ext  Pt had increase CTS with tight , sustained grip during 2 lbs exercises  - during sup/pro , UD and RD , but worse with wrist flexion /extention   PUtty teal for grip , 3 point and  Lat grip,  twisting - pt had no symptoms- attempted green putty - but had increase symptoms with lat and pulling as well as twisting -  pt to keep same putty but try with teal pulling    Cont with MEd N glides           OT Education - 08/09/15 1730    Education provided Yes   Education Details HEP   Person(s) Educated Patient   Methods Explanation;Demonstration;Tactile cues;Verbal cues   Comprehension Verbal cues required;Returned demonstration;Verbalized understanding          OT Short Term Goals - 08/09/15 1733    OT SHORT TERM GOAL #1   Title Pain on PRWHE improve with at least 10 points    Baseline at eval 22/10 - will check next week    Time 2   Period Weeks   Status On-going   OT SHORT TERM GOAL #2   Title AROM at wrist flexion , extention , supination improve with more 10-15 degrees to turn doorkonb and push door open    Baseline see flowsheet - progressed very well - but CTS    Time 3   Period Weeks   Status On-going   OT SHORT TERM GOAL #3   Title Pt to be ind in HEP and modifications in tasks  to increase ROM , decrease pain and sensory changes    Baseline CTS     Status Partially Met           OT Long Term Goals - 08/09/15 1734    OT LONG TERM GOAL #1   Title Grip strenght in L hand improve with 5-10 lbs to carry more than 6 lbs    Baseline grip increase to 40 lbs but need t oassess if can carry 6 lbs    Time 3   Period Weeks   Status On-going   OT LONG TERM GOAL #2   Title Prehension strength improve with at least 3-4 lbs to open package, open jar    Baseline see flowsheet   Time 3   Period Weeks   Status On-going               Plan - 08/09/15 1731    Clinical Impression Statement Pt cont to have CTS - pt having nerve conduction next week - pt to hold off on flexion adn extnetion with 2 lbs - but do rest and keep same putty - was unable to upgrade - had increase symptoms - but appear pt do have decreaes pain with wearing of wrist splint at night time    Pt will benefit from skilled therapeutic intervention in order to improve on the following deficits (Retired) Decreased range of motion;Impaired flexibility;Impaired sensation;Pain;Decreased strength;Impaired UE functional use   Rehab Potential Good   OT Frequency 1x / week   OT Duration 2 weeks   OT Treatment/Interventions Patient/family education;Therapeutic exercise;Self-care/ADL training;Manual Therapy;Splinting;Fluidtherapy;Contrast Bath;Passive range of motion   Plan results of nerve conduction    OT Home Exercise Plan see pt instruction   Consulted and Agree with Plan of Care Patient        Problem List There are no active problems to display for this patient.   Rosalyn Gess OTR/l,CLT  08/09/2015, 5:36 PM  [place] [facility] [address], [place], [address] Phone: [phone]   Fax:  [phone]  Name: ARTHA STAVROS MRN: [id number] Date of Birth: 1955-09-28

## 2015-08-09 NOTE — Patient Instructions (Signed)
pt to cont with stretches but hold off on 2 lbs for wrist flexion/ext  Cont with same 2 lbs and teal putty

## 2015-08-16 ENCOUNTER — Ambulatory Visit: Payer: 59 | Admitting: Occupational Therapy

## 2016-07-02 DIAGNOSIS — H6690 Otitis media, unspecified, unspecified ear: Secondary | ICD-10-CM | POA: Diagnosis not present

## 2016-07-02 DIAGNOSIS — Z23 Encounter for immunization: Secondary | ICD-10-CM | POA: Diagnosis not present

## 2016-07-02 DIAGNOSIS — E7800 Pure hypercholesterolemia, unspecified: Secondary | ICD-10-CM | POA: Diagnosis not present

## 2016-07-02 DIAGNOSIS — Z0000 Encounter for general adult medical examination without abnormal findings: Secondary | ICD-10-CM | POA: Diagnosis not present

## 2016-07-29 DIAGNOSIS — J301 Allergic rhinitis due to pollen: Secondary | ICD-10-CM | POA: Diagnosis not present

## 2016-07-29 DIAGNOSIS — H6980 Other specified disorders of Eustachian tube, unspecified ear: Secondary | ICD-10-CM | POA: Diagnosis not present

## 2016-08-22 DIAGNOSIS — Z719 Counseling, unspecified: Secondary | ICD-10-CM | POA: Diagnosis not present

## 2016-12-01 DIAGNOSIS — Z01419 Encounter for gynecological examination (general) (routine) without abnormal findings: Secondary | ICD-10-CM | POA: Diagnosis not present

## 2016-12-01 DIAGNOSIS — Z6823 Body mass index (BMI) 23.0-23.9, adult: Secondary | ICD-10-CM | POA: Diagnosis not present

## 2016-12-12 DIAGNOSIS — Z803 Family history of malignant neoplasm of breast: Secondary | ICD-10-CM | POA: Diagnosis not present

## 2016-12-12 DIAGNOSIS — Z1231 Encounter for screening mammogram for malignant neoplasm of breast: Secondary | ICD-10-CM | POA: Diagnosis not present

## 2017-07-07 DIAGNOSIS — J069 Acute upper respiratory infection, unspecified: Secondary | ICD-10-CM | POA: Diagnosis not present

## 2017-07-07 DIAGNOSIS — E7800 Pure hypercholesterolemia, unspecified: Secondary | ICD-10-CM | POA: Diagnosis not present

## 2017-07-07 DIAGNOSIS — Z0000 Encounter for general adult medical examination without abnormal findings: Secondary | ICD-10-CM | POA: Diagnosis not present

## 2017-07-07 DIAGNOSIS — M25521 Pain in right elbow: Secondary | ICD-10-CM | POA: Diagnosis not present

## 2018-07-16 DIAGNOSIS — Z0000 Encounter for general adult medical examination without abnormal findings: Secondary | ICD-10-CM | POA: Diagnosis not present

## 2018-07-16 DIAGNOSIS — M85851 Other specified disorders of bone density and structure, right thigh: Secondary | ICD-10-CM | POA: Diagnosis not present

## 2018-07-16 DIAGNOSIS — E7800 Pure hypercholesterolemia, unspecified: Secondary | ICD-10-CM | POA: Diagnosis not present

## 2020-04-02 ENCOUNTER — Other Ambulatory Visit: Payer: Self-pay

## 2020-04-02 ENCOUNTER — Encounter: Payer: Self-pay | Admitting: Dermatology

## 2020-04-02 ENCOUNTER — Ambulatory Visit (INDEPENDENT_AMBULATORY_CARE_PROVIDER_SITE_OTHER): Payer: 59 | Admitting: Dermatology

## 2020-04-02 DIAGNOSIS — Z1283 Encounter for screening for malignant neoplasm of skin: Secondary | ICD-10-CM | POA: Diagnosis not present

## 2020-04-02 DIAGNOSIS — Z85828 Personal history of other malignant neoplasm of skin: Secondary | ICD-10-CM | POA: Diagnosis not present

## 2020-04-02 DIAGNOSIS — L814 Other melanin hyperpigmentation: Secondary | ICD-10-CM | POA: Diagnosis not present

## 2020-04-02 DIAGNOSIS — L570 Actinic keratosis: Secondary | ICD-10-CM

## 2020-04-02 DIAGNOSIS — L578 Other skin changes due to chronic exposure to nonionizing radiation: Secondary | ICD-10-CM

## 2020-04-02 DIAGNOSIS — Z86018 Personal history of other benign neoplasm: Secondary | ICD-10-CM

## 2020-04-02 DIAGNOSIS — D1800 Hemangioma unspecified site: Secondary | ICD-10-CM

## 2020-04-02 DIAGNOSIS — C44612 Basal cell carcinoma of skin of right upper limb, including shoulder: Secondary | ICD-10-CM

## 2020-04-02 DIAGNOSIS — D492 Neoplasm of unspecified behavior of bone, soft tissue, and skin: Secondary | ICD-10-CM

## 2020-04-02 DIAGNOSIS — L821 Other seborrheic keratosis: Secondary | ICD-10-CM

## 2020-04-02 DIAGNOSIS — D229 Melanocytic nevi, unspecified: Secondary | ICD-10-CM

## 2020-04-02 NOTE — Progress Notes (Signed)
New Patient Visit  Subjective  Lisa Mclean is a 64 y.o. female who presents for the following: Annual Exam (TBSE, Hx of BCC, Hx of Dysplastic nevus ). The patient presents for Total-Body Skin Exam (TBSE) for skin cancer screening and mole check.  The following portions of the chart were reviewed this encounter and updated as appropriate:  Tobacco  Allergies  Meds  Problems  Med Hx  Surg Hx  Fam Hx     Review of Systems:  No other skin or systemic complaints except as noted in HPI or Assessment and Plan.  Objective  Well appearing patient in no apparent distress; mood and affect are within normal limits.  A full examination was performed including scalp, head, eyes, ears, nose, lips, neck, chest, axillae, abdomen, back, buttocks, bilateral upper extremities, bilateral lower extremities, hands, feet, fingers, toes, fingernails, and toenails. All findings within normal limits unless otherwise noted below.  Objective  Left Breast: Well healed scar with no evidence of recurrence.   Objective  Left mid back: Scar with no evidence of recurrence.   Objective  R  top of shoulder: 1.5 cm Crusted pink papule  Objective  upper lip x 2 (2): Erythematous thin papules/macules with gritty scale.    Assessment & Plan  History of basal cell carcinoma (BCC) Left Breast  Clear. Observe for recurrence. Call clinic for new or changing lesions.  Recommend regular skin exams, daily broad-spectrum spf 30+ sunscreen use, and photoprotection.     History of dysplastic nevus Left mid back  Clear. Observe for recurrence. Call clinic for new or changing lesions.  Recommend regular skin exams, daily broad-spectrum spf 30+ sunscreen use, and photoprotection.     Neoplasm of skin R  top of shoulder  Skin / nail biopsy Type of biopsy: tangential   Informed consent: discussed and consent obtained   Patient was prepped and draped in usual sterile fashion: area prepped with  alochol. Anesthesia: the lesion was anesthetized in a standard fashion   Anesthetic:  1% lidocaine w/ epinephrine 1-100,000 buffered w/ 8.4% NaHCO3 Instrument used: flexible razor blade   Hemostasis achieved with: pressure, aluminum chloride and electrodesiccation   Outcome: patient tolerated procedure well   Post-procedure details: wound care instructions given   Post-procedure details comment:  Ointment and small bandage  Specimen 1 - Surgical pathology Differential Diagnosis: R/O BCC  Check Margins: No 1.5 cm Crusted pink papule  AK (actinic keratosis) (2) upper lip x 2  Destruction of lesion - upper lip x 2 Complexity: simple   Destruction method: cryotherapy   Informed consent: discussed and consent obtained   Timeout:  patient name, date of birth, surgical site, and procedure verified Lesion destroyed using liquid nitrogen: Yes   Region frozen until ice ball extended beyond lesion: Yes   Outcome: patient tolerated procedure well with no complications   Post-procedure details: wound care instructions given     Lentigines - Scattered tan macules - Discussed due to sun exposure - Benign, observe - Call for any changes  Seborrheic Keratoses - Stuck-on, waxy, tan-brown papules and plaques  - Discussed benign etiology and prognosis. - Observe - Call for any changes  Melanocytic Nevi - Tan-brown and/or pink-flesh-colored symmetric macules and papules - Benign appearing on exam today - Observation - Call clinic for new or changing moles - Recommend daily use of broad spectrum spf 30+ sunscreen to sun-exposed areas.   Hemangiomas - Red papules - Discussed benign nature - Observe - Call for any  changes  Actinic Damage - Chronic, secondary to cumulative UV/sun exposure - diffuse scaly erythematous macules with underlying dyspigmentation - Recommend daily broad spectrum sunscreen SPF 30+ to sun-exposed areas, reapply every 2 hours as needed.  - Call for new or  changing lesions.  Skin cancer screening performed today.  Return in about 2 months (around 06/02/2020) for Aks .  IMarye Round, CMA, am acting as scribe for Sarina Ser, MD .  Documentation: I have reviewed the above documentation for accuracy and completeness, and I agree with the above.  Sarina Ser, MD

## 2020-04-02 NOTE — Patient Instructions (Signed)

## 2020-04-03 ENCOUNTER — Encounter: Payer: Self-pay | Admitting: Dermatology

## 2020-04-04 ENCOUNTER — Telehealth: Payer: Self-pay

## 2020-04-04 NOTE — Telephone Encounter (Signed)
Left message with female for patient to return my call.  

## 2020-04-04 NOTE — Telephone Encounter (Signed)
-----   Message from Ralene Bathe, MD sent at 04/03/2020  7:42 PM EST ----- Diagnosis Skin , R top of shoulder BASAL CELL CARCINOMA, SUPERFICIAL AND NODULAR PATTERNS  Cancer - BCC Schedule for treatment ([facility])

## 2020-04-09 NOTE — Telephone Encounter (Signed)
Informed pt of results. Pt had follow up scheduled on 06/04/20 for AKs, will plan to do [facility] at that appt.

## 2020-06-04 ENCOUNTER — Ambulatory Visit (INDEPENDENT_AMBULATORY_CARE_PROVIDER_SITE_OTHER): Payer: 59 | Admitting: Dermatology

## 2020-06-04 ENCOUNTER — Encounter: Payer: Self-pay | Admitting: Dermatology

## 2020-06-04 ENCOUNTER — Other Ambulatory Visit: Payer: Self-pay

## 2020-06-04 DIAGNOSIS — Z872 Personal history of diseases of the skin and subcutaneous tissue: Secondary | ICD-10-CM | POA: Diagnosis not present

## 2020-06-04 DIAGNOSIS — L578 Other skin changes due to chronic exposure to nonionizing radiation: Secondary | ICD-10-CM

## 2020-06-04 DIAGNOSIS — C44612 Basal cell carcinoma of skin of right upper limb, including shoulder: Secondary | ICD-10-CM | POA: Diagnosis not present

## 2020-06-04 NOTE — Patient Instructions (Signed)

## 2020-06-04 NOTE — Progress Notes (Signed)
   Follow-Up Visit   Subjective  Lisa Mclean is a 65 y.o. female who presents for the following: Basal cell carcinoma (Bx proven - R top of shoulder, patient is here today for [facility] ). Recheck AK's - upper lip x 2, check for recurrence or persistence.  The following portions of the chart were reviewed this encounter and updated as appropriate:   Tobacco  Allergies  Meds  Problems  Med Hx  Surg Hx  Fam Hx     Review of Systems:  No other skin or systemic complaints except as noted in HPI or Assessment and Plan.  Objective  Well appearing patient in no apparent distress; mood and affect are within normal limits.  A focused examination was performed including the face and right shoulder . Relevant physical exam findings are noted in the Assessment and Plan.  Objective  R top of shoulder: Pink biopsy site  Objective  Upper lip: Clear.  Assessment & Plan  Basal cell carcinoma (BCC) of skin of right upper extremity including shoulder R top of shoulder  Destruction of lesion Complexity: extensive   Destruction method: electrodesiccation and curettage   Informed consent: discussed and consent obtained   Timeout:  patient name, date of birth, surgical site, and procedure verified Procedure prep:  Patient was prepped and draped in usual sterile fashion Prep type:  Isopropyl alcohol Anesthesia: the lesion was anesthetized in a standard fashion   Anesthetic:  1% lidocaine w/ epinephrine 1-100,000 buffered w/ 8.4% NaHCO3 Curettage performed in three different directions: Yes   Electrodesiccation performed over the curetted area: Yes   Lesion length (cm):  1.9 Lesion width (cm):  1.9 Margin per side (cm):  0.2 Final wound size (cm):  2.3 Hemostasis achieved with:  pressure, aluminum chloride and electrodesiccation Outcome: patient tolerated procedure well with no complications   Post-procedure details: sterile dressing applied and wound care instructions given   Dressing  type: bandage and petrolatum    Epidermal / dermal shaving  Lesion diameter (cm):  2.3 Informed consent: discussed and consent obtained   Timeout: patient name, date of birth, surgical site, and procedure verified   Procedure prep:  Patient was prepped and draped in usual sterile fashion Prep type:  Isopropyl alcohol Anesthesia: the lesion was anesthetized in a standard fashion   Anesthetic:  1% lidocaine w/ epinephrine 1-100,000 buffered w/ 8.4% NaHCO3 Instrument used: flexible razor blade   Hemostasis achieved with: pressure, aluminum chloride and electrodesiccation   Outcome: patient tolerated procedure well   Post-procedure details: sterile dressing applied and wound care instructions given   Dressing type: bandage and petrolatum    History of actinic keratosis Upper lip  Clear. Observe for recurrence. Call clinic for new or changing lesions.  Recommend regular skin exams, daily broad-spectrum spf 30+ sunscreen use, and photoprotection.      Actinic Damage with hx of AKs (clear today) - chronic, secondary to cumulative UV radiation exposure/sun exposure over time - diffuse scaly erythematous macules with underlying dyspigmentation - Recommend daily broad spectrum sunscreen SPF 30+ to sun-exposed areas, reapply every 2 hours as needed.  - Call for new or changing lesions.  Return for TBSE in 6-12 months.  Luther Redo, CMA, am acting as scribe for Sarina Ser, MD .  Documentation: I have reviewed the above documentation for accuracy and completeness, and I agree with the above.  Sarina Ser, MD

## 2020-06-05 ENCOUNTER — Encounter: Payer: Self-pay | Admitting: Dermatology

## 2020-06-07 ENCOUNTER — Other Ambulatory Visit: Payer: Self-pay | Admitting: Otolaryngology

## 2020-06-07 DIAGNOSIS — R221 Localized swelling, mass and lump, neck: Secondary | ICD-10-CM

## 2020-06-20 ENCOUNTER — Ambulatory Visit
Admission: RE | Admit: 2020-06-20 | Discharge: 2020-06-20 | Disposition: A | Discharge status: Home / Self Care | Payer: 59 | Source: Ambulatory Visit | Attending: Otolaryngology | Admitting: Otolaryngology

## 2020-06-20 ENCOUNTER — Other Ambulatory Visit: Payer: Self-pay

## 2020-06-20 DIAGNOSIS — R221 Localized swelling, mass and lump, neck: Secondary | ICD-10-CM | POA: Diagnosis not present

## 2020-06-20 LAB — POCT I-STAT CREATININE: Creatinine, Ser: 1.1 mg/dL — ABNORMAL HIGH (ref 0.44–1.00)

## 2020-06-20 MED ORDER — IOHEXOL 300 MG/ML  SOLN
75.0000 mL | Freq: Once | INTRAMUSCULAR | Status: AC | PRN
  Administered 2020-06-20: 75 mL via INTRAVENOUS

## 2021-01-21 ENCOUNTER — Encounter: Payer: 59 | Admitting: Dermatology

## 2021-10-21 IMAGING — CT CT NECK W/ CM
3 of 5 series · 11 of 33 positions shown, 13 images · IV contrast (omnipaque)
Comparison: None.

CLINICAL DATA: Neck mass.

EXAM:
CT NECK WITH CONTRAST
TECHNIQUE: Multidetector CT imaging of the neck was performed using the
standard protocol following the bolus administration of intravenous
contrast.
CONTRAST:  75mL OMNIPAQUE IOHEXOL 300 MG/ML  SOLN

[Series 4: coronal neck neck (person_name) 2.00 cor · coronal · 0.57mm/px · 3 of 105 slices shown]
[im 33/105  bone]
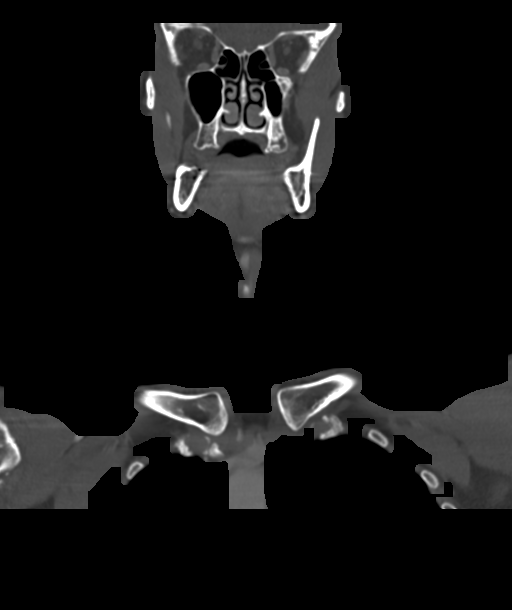
[im 46/105  bone]
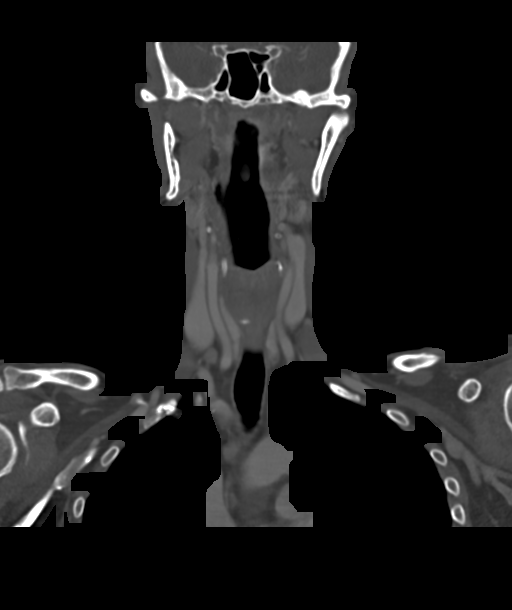
[im 59/105  bone]
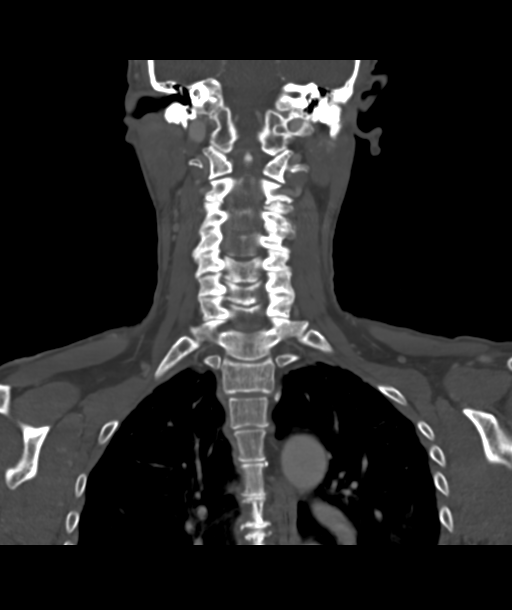

[Series 6: sagittal neck neck (person_name) 2.00 sag · sagittal · 0.41mm/px · 5 of 145 slices shown, 6 images]
[im 49/145  bone]
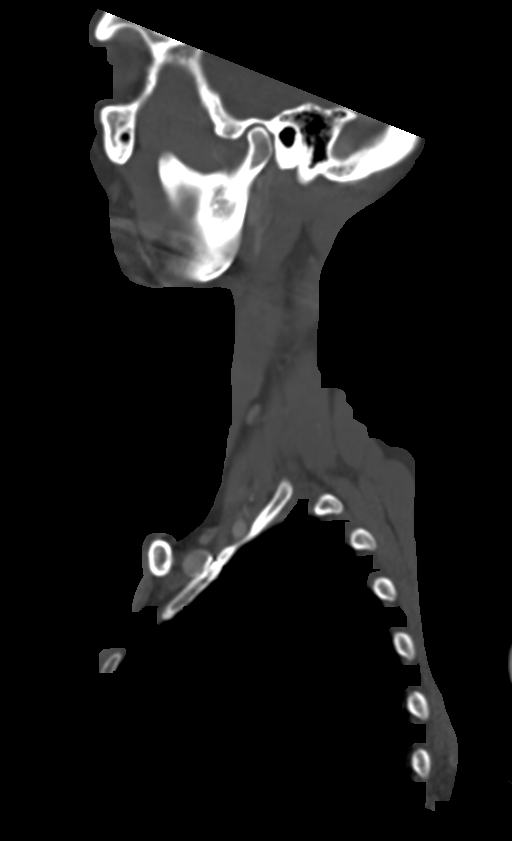
[im 61/145  bone]
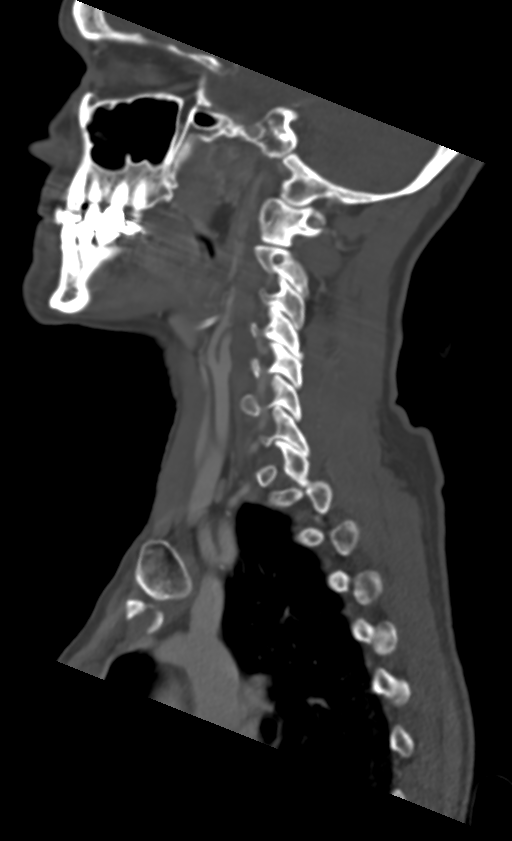
[im 73/145  soft-tissue]
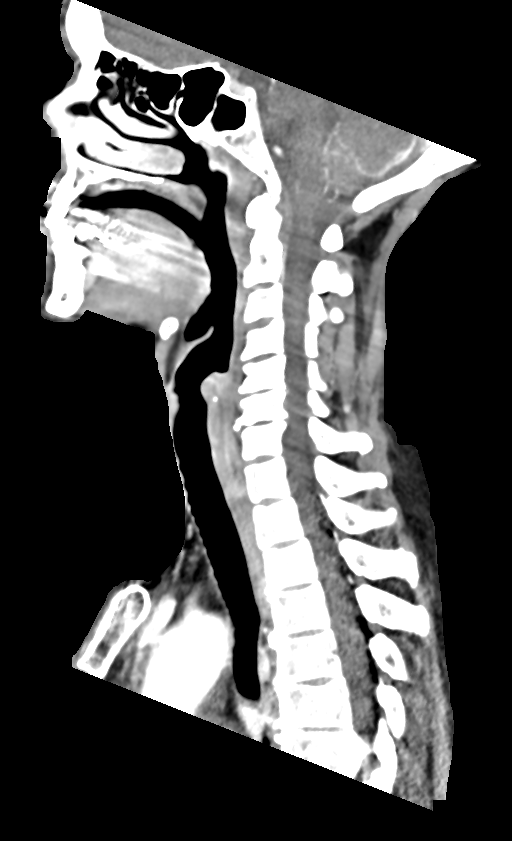
[im 73/145  bone]
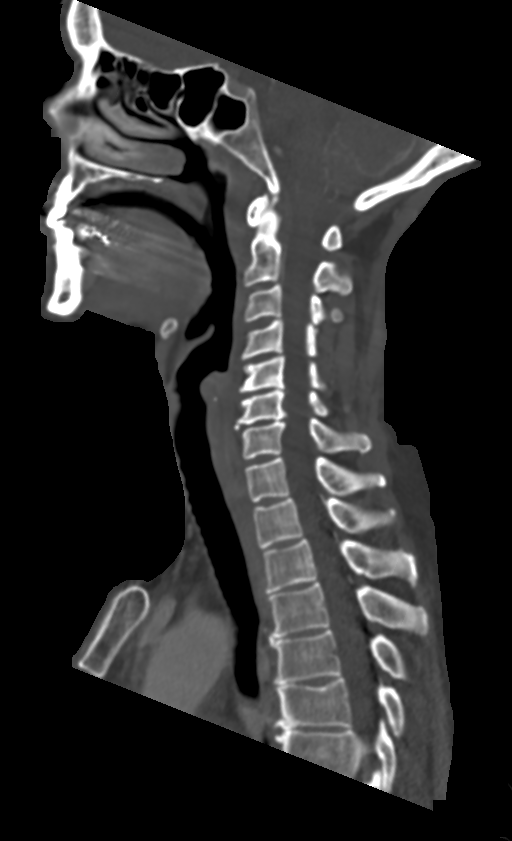
[im 85/145  bone]
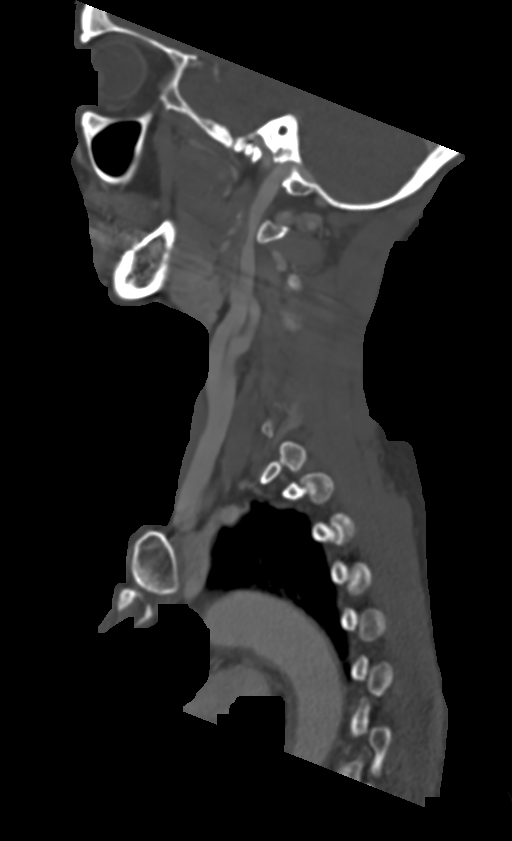
[im 97/145  bone]
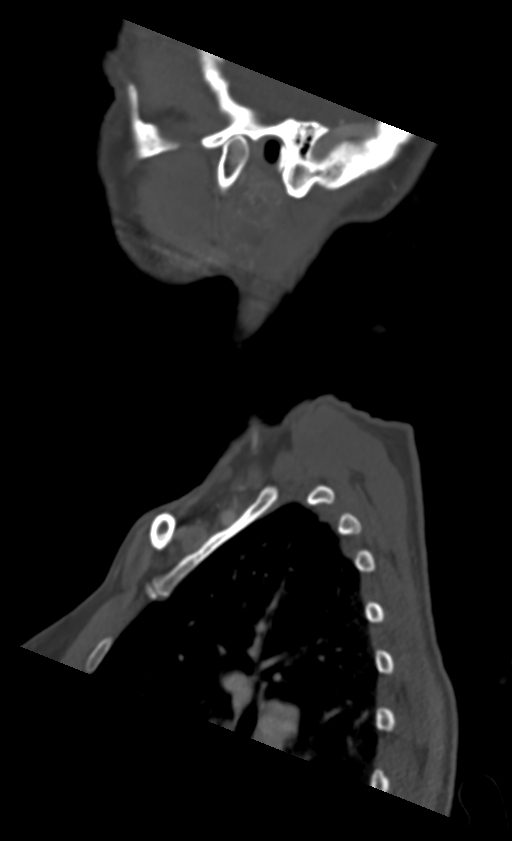

[Series 8: ax oropharynx neck neck (person_name) 2.00 ax · axial · 0.41mm/px · z∈[-726,-535]mm · 3 of 173 slices shown, 4 images]
[im 35/173  soft-tissue]
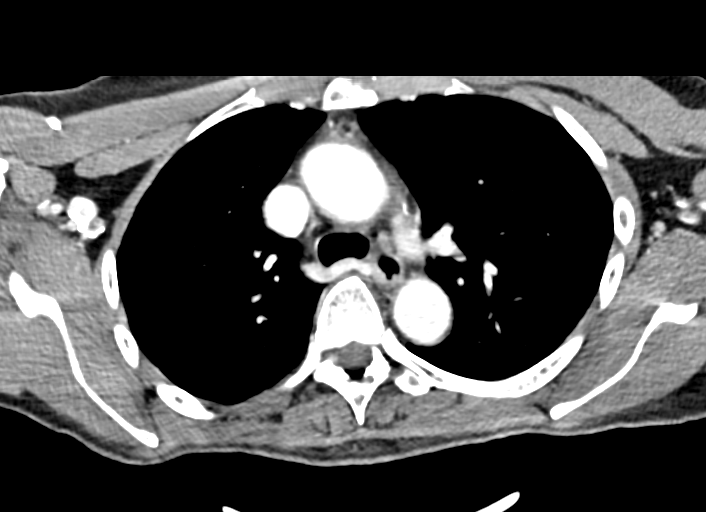
[im 35/173  bone]
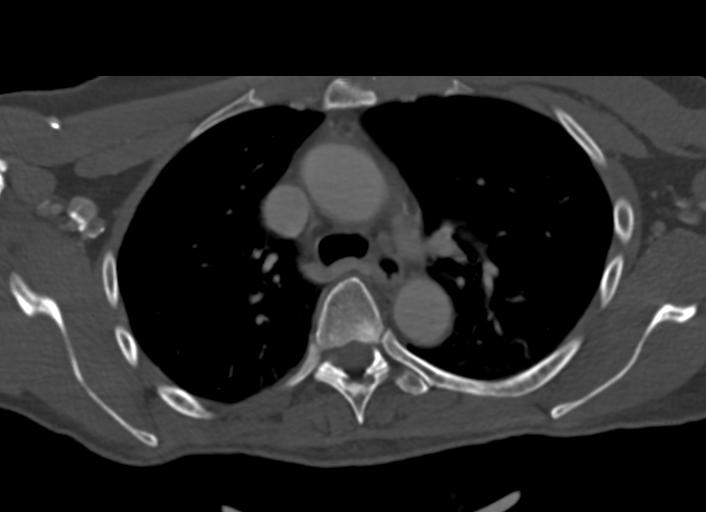
[im 104/173  bone]
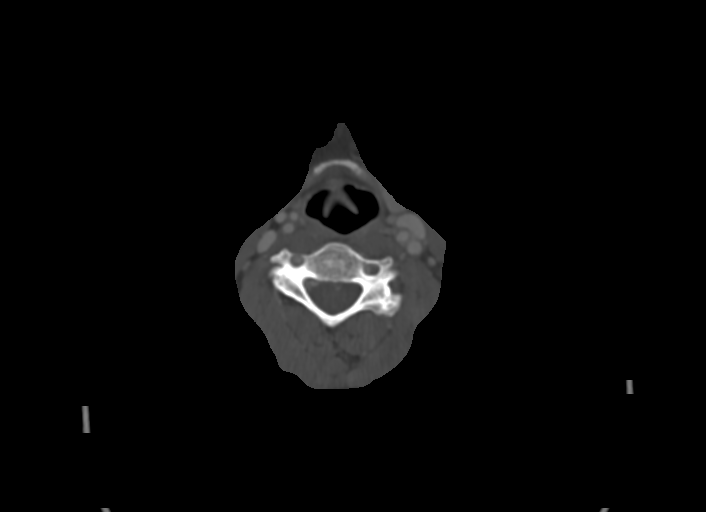
[im 138/173  bone]
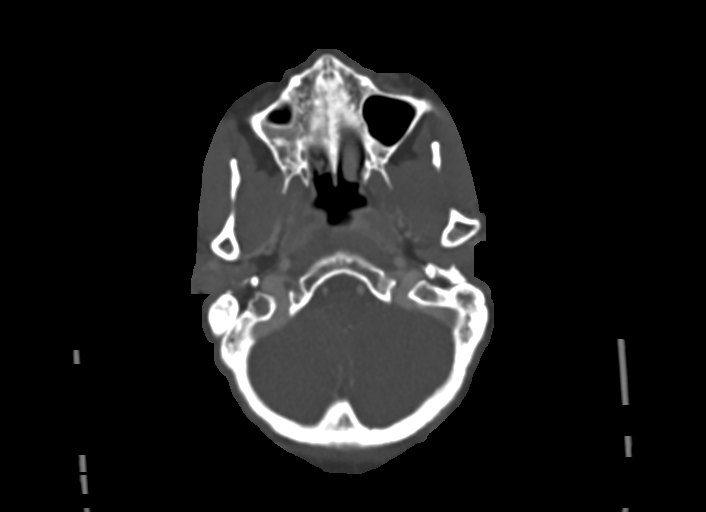

[11 of 33 positions shown; findings below may reference images not displayed]

FINDINGS: Pharynx and larynx: No evidence of mass or swelling. Widely patent
airway.

Salivary glands: There is heterogeneous enlargement of the left
parotid gland with evidence of an underlying 3.0 x 2.6 cm mass which
is largely hypoattenuating inferiorly with hyperenhancing nodular
components more superiorly (with an alternative possibility being
multiple adjacent masses rather than a single dominant mass). Normal
appearance of the left stylomastoid foramen. No right parotid or
submandibular mass.

Thyroid: Unremarkable.

Lymph nodes: No enlarged or suspicious lymph nodes in the neck.

Vascular: Major vascular structures of the neck are patent.

Limited intracranial: Unremarkable.

Visualized orbits: Unremarkable.

Mastoids and visualized paranasal sinuses: Clear.

Skeleton: No suspicious osseous lesion. Asymmetrically severe left
facet arthrosis at C2-3 and C3-4. Moderate disc degeneration at C5-6
and C6-7.

Upper chest: No apical lung consolidation or mass.

Other: None.
IMPRESSION: 3 cm left parotid mass most likely representing a primary salivary
neoplasm. No evidence of cervical lymphadenopathy.

## 2021-11-26 ENCOUNTER — Other Ambulatory Visit: Payer: Self-pay | Admitting: Internal Medicine

## 2021-11-26 DIAGNOSIS — R079 Chest pain, unspecified: Secondary | ICD-10-CM

## 2021-11-26 DIAGNOSIS — I208 Other forms of angina pectoris: Secondary | ICD-10-CM

## 2021-12-12 ENCOUNTER — Other Ambulatory Visit: Payer: 59

## 2022-04-09 ENCOUNTER — Telehealth (HOSPITAL_COMMUNITY): Payer: Self-pay | Admitting: Emergency Medicine

## 2022-04-09 DIAGNOSIS — R079 Chest pain, unspecified: Secondary | ICD-10-CM

## 2022-04-09 MED ORDER — METOPROLOL TARTRATE 100 MG PO TABS: 100.0000 mg | ORAL_TABLET | Freq: Once | ORAL | 0 refills | Status: DC

## 2022-04-09 MED ORDER — IVABRADINE HCL 5 MG PO TABS: 15.0000 mg | ORAL_TABLET | Freq: Once | ORAL | 0 refills | Status: AC

## 2022-04-09 NOTE — Telephone Encounter (Signed)
Reaching out to patient to offer assistance regarding upcoming cardiac imaging study; pt verbalizes understanding of appt date/time, parking situation and where to check in, pre-test NPO status and medications ordered, and verified current allergies; name and call back number provided for further questions should they arise Marchia Bond RN Navigator Cardiac Imaging [facility] [phone] office [phone] cell  Arrival 300 Denies iv issues '100mg'$  metoprolol + '15mg'$  ivabradine

## 2022-04-10 ENCOUNTER — Ambulatory Visit
Admission: RE | Admit: 2022-04-10 | Discharge: 2022-04-10 | Disposition: A | Discharge status: Home / Self Care | Payer: 59 | Source: Ambulatory Visit | Attending: Internal Medicine | Admitting: Internal Medicine

## 2022-04-10 DIAGNOSIS — I2089 Other forms of angina pectoris: Secondary | ICD-10-CM | POA: Insufficient documentation

## 2022-04-10 DIAGNOSIS — R079 Chest pain, unspecified: Secondary | ICD-10-CM | POA: Insufficient documentation

## 2022-04-10 LAB — POCT I-STAT CREATININE: Creatinine, Ser: 1 mg/dL (ref 0.44–1.00)

## 2022-04-10 MED ORDER — NITROGLYCERIN 0.4 MG SL SUBL
0.8000 mg | SUBLINGUAL_TABLET | Freq: Once | SUBLINGUAL | Status: AC
  Administered 2022-04-10: 0.8 mg via SUBLINGUAL

## 2022-04-10 MED ORDER — IOHEXOL 350 MG/ML SOLN
75.0000 mL | Freq: Once | INTRAVENOUS | Status: AC | PRN
  Administered 2022-04-10: 75 mL via INTRAVENOUS

## 2022-04-10 NOTE — Progress Notes (Signed)
Patient tolerated procedure well. Ambulate w/o difficulty. Denies light headedness or being dizzy. Sitting in chair drinking water provided. Encouraged to drink extra water today and reasoning explained. Verbalized understanding. All questions answered. ABC intact. No further needs. Discharge from procedure area w/o issues.   °

## 2024-01-15 DIAGNOSIS — S93401A Sprain of unspecified ligament of right ankle, initial encounter: Secondary | ICD-10-CM | POA: Diagnosis not present

## 2024-01-28 DIAGNOSIS — C44529 Squamous cell carcinoma of skin of other part of trunk: Secondary | ICD-10-CM | POA: Diagnosis not present

## 2024-01-28 DIAGNOSIS — M1990 Unspecified osteoarthritis, unspecified site: Secondary | ICD-10-CM | POA: Diagnosis not present

## 2024-01-28 DIAGNOSIS — E785 Hyperlipidemia, unspecified: Secondary | ICD-10-CM | POA: Diagnosis not present

## 2024-01-28 DIAGNOSIS — C44612 Basal cell carcinoma of skin of right upper limb, including shoulder: Secondary | ICD-10-CM | POA: Diagnosis not present

## 2024-01-28 DIAGNOSIS — Z8719 Personal history of other diseases of the digestive system: Secondary | ICD-10-CM | POA: Diagnosis not present

## 2024-01-28 DIAGNOSIS — Z131 Encounter for screening for diabetes mellitus: Secondary | ICD-10-CM | POA: Diagnosis not present

## 2024-01-28 DIAGNOSIS — Z9181 History of falling: Secondary | ICD-10-CM | POA: Diagnosis not present

## 2024-01-29 DIAGNOSIS — E559 Vitamin D deficiency, unspecified: Secondary | ICD-10-CM | POA: Diagnosis not present

## 2024-01-29 DIAGNOSIS — E538 Deficiency of other specified B group vitamins: Secondary | ICD-10-CM | POA: Diagnosis not present

## 2024-01-29 DIAGNOSIS — R7309 Other abnormal glucose: Secondary | ICD-10-CM | POA: Diagnosis not present

## 2024-01-29 DIAGNOSIS — M1990 Unspecified osteoarthritis, unspecified site: Secondary | ICD-10-CM | POA: Diagnosis not present

## 2024-01-29 DIAGNOSIS — E785 Hyperlipidemia, unspecified: Secondary | ICD-10-CM | POA: Diagnosis not present

## 2024-02-17 DIAGNOSIS — C44612 Basal cell carcinoma of skin of right upper limb, including shoulder: Secondary | ICD-10-CM | POA: Diagnosis not present

## 2024-02-26 DIAGNOSIS — J0100 Acute maxillary sinusitis, unspecified: Secondary | ICD-10-CM | POA: Diagnosis not present

## 2024-02-26 DIAGNOSIS — D110 Benign neoplasm of parotid gland: Secondary | ICD-10-CM | POA: Diagnosis not present

## 2024-02-26 DIAGNOSIS — H6982 Other specified disorders of Eustachian tube, left ear: Secondary | ICD-10-CM | POA: Diagnosis not present

## 2024-03-02 DIAGNOSIS — D045 Carcinoma in situ of skin of trunk: Secondary | ICD-10-CM | POA: Diagnosis not present

## 2024-05-18 NOTE — Progress Notes (Signed)
" °  Cardiology Office Note   Date:  05/20/2024  ID:  Lisa Mclean, DOB Apr 05, 1956, MRN [id number] PCP: Jaime Rosaline RAMAN, FNP  [facility] Providers Cardiologist:  Caron Poser, MD     History of Present Illness Lisa Mclean is a 69 y.o. female PMH HLD with statin intolerance who presents for further evaluation management of DOE.  Seen by PCP for this issue 05/16/2024.  Based on office notes, patient reports a several month history of intermittent DOE.  Also reports palpitations.  No LDL on file.  She had a negative coronary CT angiogram 04/2022 done here.  Patient reports a 2 to 29-month history of dyspnea on exertion.  Denies any chest discomfort.  Also reports intermittent palpitations that seem to occur at night while resting.  No syncopal episodes.  Denies any orthopnea or significant LE edema.  Relevant CVD History -CCTA 04/2022 CAD RADS 0   ROS: Pt denies any chest discomfort, jaw pain, arm pain, syncope, presyncope, orthopnea, PND, or LE edema.  Studies Reviewed I have independently reviewed the patient's ECG, previous cardiac testing, recent medical records.  Physical Exam VS:  BP (!) 172/108 (BP Location: Right Arm, Patient Position: Sitting, Cuff Size: Normal)   Pulse 77   Ht 5' 2 (1.575 m)   Wt 140 lb (63.5 kg)   SpO2 100%   BMI 25.61 kg/m        Wt Readings from Last 3 Encounters:  05/20/24 140 lb (63.5 kg)  05/20/15 140 lb (63.5 kg)  10/21/12 140 lb (63.5 kg)    GEN: No acute distress. NECK: No JVD; No carotid bruits. CARDIAC: RRR, no murmurs, rubs, gallops. RESPIRATORY:  Clear to auscultation. EXTREMITIES:  Warm and well-perfused. No edema.  ASSESSMENT AND PLAN Dyspnea on exertion Remains undifferentiated.  Coronary CTA 04/2022 CAD RADS 0, making obstructive CAD very unlikely.  No other symptoms of heart failure - no edema, orthopnea, etc.  She is hypertensive, so do wonder about longstanding untreated hypertension that worsens with  exertion.  Plan: - Echocardiogram to evaluate for structural/HF etiologies - No ischemic workup given relatively recent normal coronary CTA  Paroxysmal tachycardia Palpitation Undifferentiated.  No high risk features such as syncope.  Further workup indicated.  Plan: - Check thyroid  panel - Zio monitor to evaluate for arrhythmia - Echocardiogram as above  HLD with statin intolerance No recent LDL on file.  Has had intolerance to 3 different statins.  I recommended starting Zetia  as well as consideration of Repatha.  She is willing to try Zetia , but does not want to do any injectables.  I also recommended that she meet with our pharmacist to discuss other alternatives.  She is not interested at this time.  Plan: - Check lipid panel - Start Zetia  10 mg daily - Continue to recommend statin alternatives at subsequent appointment  HTN Uncontrolled.  172/108 on arrival.  Manual repeat done by me was 158/92.  I do wonder if this may be contributing to her symptoms.  I recommended we start losartan 25 mg daily.  She would like to just check blood pressures at home over the next few weeks and then send her numbers to me and we can decide on a pharmacotherapy approach together.        Dispo: RTC 3 months or sooner as needed  Signed, Caron Poser, MD  "

## 2024-05-19 ENCOUNTER — Ambulatory Visit
Admission: RE | Admit: 2024-05-19 | Discharge: 2024-05-19 | Disposition: A | Discharge status: Home / Self Care | Attending: Adult Health | Admitting: Adult Health

## 2024-05-19 ENCOUNTER — Other Ambulatory Visit: Payer: Self-pay | Admitting: Adult Health

## 2024-05-19 ENCOUNTER — Ambulatory Visit
Admission: RE | Admit: 2024-05-19 | Discharge: 2024-05-19 | Disposition: A | Discharge status: Home / Self Care | Source: Ambulatory Visit | Attending: Adult Health | Admitting: Adult Health

## 2024-05-19 DIAGNOSIS — M25551 Pain in right hip: Secondary | ICD-10-CM | POA: Insufficient documentation

## 2024-05-19 DIAGNOSIS — R0609 Other forms of dyspnea: Secondary | ICD-10-CM

## 2024-05-19 DIAGNOSIS — M25552 Pain in left hip: Secondary | ICD-10-CM | POA: Diagnosis present

## 2024-05-19 DIAGNOSIS — M545 Low back pain, unspecified: Secondary | ICD-10-CM

## 2024-05-20 ENCOUNTER — Ambulatory Visit

## 2024-05-20 ENCOUNTER — Other Ambulatory Visit: Payer: Self-pay | Admitting: *Deleted

## 2024-05-20 ENCOUNTER — Encounter: Payer: Self-pay | Admitting: *Deleted

## 2024-05-20 VITALS — BP 172/108 | HR 77 | Ht 62.0 in | Wt 140.0 lb

## 2024-05-20 DIAGNOSIS — E782 Mixed hyperlipidemia: Secondary | ICD-10-CM

## 2024-05-20 DIAGNOSIS — R002 Palpitations: Secondary | ICD-10-CM

## 2024-05-20 DIAGNOSIS — I479 Paroxysmal tachycardia, unspecified: Secondary | ICD-10-CM | POA: Diagnosis not present

## 2024-05-20 DIAGNOSIS — R0609 Other forms of dyspnea: Secondary | ICD-10-CM

## 2024-05-20 DIAGNOSIS — Z789 Other specified health status: Secondary | ICD-10-CM

## 2024-05-20 MED ORDER — EZETIMIBE 10 MG PO TABS: 10.0000 mg | ORAL_TABLET | Freq: Every day | ORAL | 3 refills | Status: AC

## 2024-05-20 NOTE — Patient Instructions (Signed)
 Medication Instructions:  Your physician recommends the following medication changes.  START TAKING: Ezetimibe  (ZETIA ) 10 mg once daily   *If you need a refill on your cardiac medications before your next appointment, please call your pharmacy*  Lab Work:  Your provider would like for you to have following labs drawn today Thyroid  Panel and Lipid Panel.   If you have labs (blood work) drawn today and your tests are completely normal, you will receive your results only by: MyChart Message (if you have MyChart) OR A paper copy in the mail If you have any lab test that is abnormal or we need to change your treatment, we will call you to review the results.  Testing/Procedures:  Your physician has requested that you have an echocardiogram. Echocardiography is a painless test that uses sound waves to create images of your heart. It provides your doctor with information about the size and shape of your heart and how well your hearts chambers and valves are working.   You may receive an ultrasound enhancing agent through an IV if needed to better visualize your heart during the echo. This procedure takes approximately one hour.  There are no restrictions for this procedure.  This will take place at [address] [facility] [address], [place] [address]  Please note: We ask at that you not bring children with you during ultrasound (echo/ vascular) testing. Due to room size and safety concerns, children are not allowed in the ultrasound rooms during exams. Our front office staff cannot provide observation of children in our lobby area while testing is being conducted. An adult accompanying a patient to their appointment will only be allowed in the ultrasound room at the discretion of the ultrasound technician under special circumstances. We apologize for any inconvenience.   ZIO XT- Long Term Monitor Instructions  Your physician has requested you wear a ZIO patch monitor for 14  days.  This is a single patch monitor. [employer] supplies one patch monitor per enrollment. Additional stickers are not available. Please do not apply patch if you will be having a Nuclear Stress Test, Echocardiogram, Cardiac CT, MRI, or Chest Xray during the period you would be wearing the monitor. The patch cannot be worn during these tests. You cannot remove and re-apply the ZIO XT patch monitor.  Your ZIO patch monitor will be mailed 3 day USPS to your address on file. It may take 3-5 days to receive your monitor after you have been enrolled. Once you have received your monitor, please review the enclosed instructions. Your monitor has already been registered assigning a specific monitor serial number to you.  Billing and Patient Assistance Program Information  We have supplied [employer] with any of your insurance information on file for billing purposes.  [employer] offers a sliding scale Patient Assistance Program for patients that do not have insurance, or whose insurance does not completely cover the cost of the ZIO monitor.  You must apply for the Patient Assistance Program to qualify for this discounted rate.  To apply, please call [employer] at [phone], select option 4, select option 2, ask to apply for Patient Assistance Program. Mered[employer] will ask your household income, and how many people are in your household. They will quote your out-of-pocket cost based on that information. [employer] will also be able to set up a 35-month, interest-free payment plan if needed.  Applying the monitor   Hold abrader disc by orange tab. Rub abrader in 40 strokes over the upper left chest  as indicated in your monitor instructions.  Clean area with 4 enclosed alcohol pads. Let dry.  Apply patch as indicated in monitor instructions. Patch will be placed under collarbone on left side of chest with arrow pointing upward.  Rub patch adhesive wings for 2 minutes. Remove white label marked 1. Remove the white label marked  2. Rub patch adhesive wings for 2 additional minutes.  While looking in a mirror, press and release button in center of patch. A small green light will flash 3-4 times. This will be your only indicator that the monitor has been turned on.   After Applying Monitor:  Do not shower for the first 24 hours. You may shower after the first 24 hours. Keep your back toward the water; monitor cannot be submerged or have direct water contact. Press the button if you feel a symptom. You will hear a small click. Record Date, Time and Symptom in the Patient Logbook.   After Completing 14 Days:  When you are ready to remove the patch, follow instructions on the last 2 pages of Patient Logbook.  Stick patch monitor into the tabs at the bottom of the return box.  Place Patient Logbook in the blue and white box. Use locking tab on box and tape box closed securely. The blue and white box has prepaid postage on it. Please place it in the mailbox as soon as possible. Your physician should have your test results approximately 7-14 days after the monitor has been mailed back to [facility].   Troubleshooting:  Call [facility] at [phone] if you have questions regarding your ZIO XT patch monitor.  Call them immediately if you see an orange light blinking on your monitor.  If your monitor falls off in less than 4 days, contact our Monitor department at [phone].  If your monitor becomes loose or falls off after 4 days call [employer] at [phone] for suggestions on securing your monitor.   Follow-Up: At [facility], you and your health needs are our priority.  As part of our continuing mission to provide you with exceptional heart care, our providers are all part of one team.  This team includes your primary Cardiologist (physician) and Advanced Practice Providers or APPs (Physician Assistants and Nurse Practitioners) who all work together to provide you with the care you  need, when you need it.  Your next appointment:  3 month(s)  Provider:  Caron Poser, MD    We recommend signing up for the patient portal called MyChart.  Sign up information is provided on this After Visit Summary.  MyChart is used to connect with patients for Virtual Visits (Telemedicine).  Patients are able to view lab/test results, encounter notes, upcoming appointments, etc.  Non-urgent messages can be sent to your provider as well.   To learn more about what you can do with MyChart, go to [email or web address].

## 2024-05-21 ENCOUNTER — Ambulatory Visit: Payer: Self-pay

## 2024-05-21 LAB — THYROID PANEL WITH TSH
Free Thyroxine Index: 1.9 (ref 1.2–4.9)
T3 Uptake Ratio: 25 % (ref 24–39)
T4, Total: 7.5 ug/dL (ref 4.5–12.0)
TSH: 1.47 u[IU]/mL (ref 0.450–4.500)

## 2024-05-21 LAB — LIPID PANEL
Chol/HDL Ratio: 3.4 ratio (ref 0.0–4.4)
Cholesterol, Total: 286 mg/dL — ABNORMAL HIGH (ref 100–199)
HDL: 84 mg/dL
LDL Chol Calc (NIH): 185 mg/dL — ABNORMAL HIGH (ref 0–99)
Triglycerides: 103 mg/dL (ref 0–149)
VLDL Cholesterol Cal: 17 mg/dL (ref 5–40)

## 2024-06-07 ENCOUNTER — Ambulatory Visit

## 2024-06-07 DIAGNOSIS — R002 Palpitations: Secondary | ICD-10-CM

## 2024-06-07 DIAGNOSIS — R0609 Other forms of dyspnea: Secondary | ICD-10-CM

## 2024-06-07 DIAGNOSIS — I479 Paroxysmal tachycardia, unspecified: Secondary | ICD-10-CM | POA: Diagnosis not present

## 2024-06-07 LAB — ECHOCARDIOGRAM COMPLETE
AR max vel: 1.99 cm2
AV Area VTI: 1.98 cm2
AV Area mean vel: 1.95 cm2
AV Mean grad: 3 mmHg
AV Peak grad: 5.3 mmHg
Ao pk vel: 1.15 m/s
Area-P 1/2: 3.77 cm2
S' Lateral: 2.8 cm

## 2024-06-16 DIAGNOSIS — R0609 Other forms of dyspnea: Secondary | ICD-10-CM | POA: Diagnosis not present

## 2024-06-16 DIAGNOSIS — I479 Paroxysmal tachycardia, unspecified: Secondary | ICD-10-CM

## 2024-08-02 ENCOUNTER — Ambulatory Visit
